# Patient Record
Sex: Male | Born: 2000 | Race: White | Hispanic: No | Marital: Single | Smoking: Never smoker
Health system: Southern US, Community
[De-identification: ages and names within clinical notes are randomized; demographics above are authoritative.]

## PROBLEM LIST (undated history)

## (undated) HISTORY — PX: KNEE ARTHROSCOPY W/ MENISCECTOMY: SHX1879

---
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
## 2019-01-06 ENCOUNTER — Emergency Department: Payer: Medicaid Other

## 2019-01-06 ENCOUNTER — Encounter: Payer: Self-pay | Admitting: Emergency Medicine

## 2019-01-06 ENCOUNTER — Emergency Department
Admission: EM | Admit: 2019-01-06 | Discharge: 2019-01-06 | Disposition: A | Discharge status: Home / Self Care | Payer: Medicaid Other | Attending: Emergency Medicine | Admitting: Emergency Medicine

## 2019-01-06 ENCOUNTER — Other Ambulatory Visit: Payer: Self-pay

## 2019-01-06 DIAGNOSIS — R0789 Other chest pain: Secondary | ICD-10-CM | POA: Insufficient documentation

## 2019-01-06 DIAGNOSIS — R079 Chest pain, unspecified: Secondary | ICD-10-CM | POA: Diagnosis present

## 2019-01-06 DIAGNOSIS — Z79899 Other long term (current) drug therapy: Secondary | ICD-10-CM | POA: Insufficient documentation

## 2019-01-06 LAB — CBC
HCT: 42.8 % (ref 39.0–52.0)
Hemoglobin: 15.1 g/dL (ref 13.0–17.0)
MCH: 30.3 pg (ref 26.0–34.0)
MCHC: 35.3 g/dL (ref 30.0–36.0)
MCV: 85.8 fL (ref 80.0–100.0)
Platelets: 289 10*3/uL (ref 150–400)
RBC: 4.99 MIL/uL (ref 4.22–5.81)
RDW: 12.2 % (ref 11.5–15.5)
WBC: 7.8 10*3/uL (ref 4.0–10.5)
nRBC: 0 % (ref 0.0–0.2)

## 2019-01-06 LAB — BASIC METABOLIC PANEL
Anion gap: 10 (ref 5–15)
BUN: 23 mg/dL — ABNORMAL HIGH (ref 6–20)
CO2: 24 mmol/L (ref 22–32)
Calcium: 9.1 mg/dL (ref 8.9–10.3)
Chloride: 104 mmol/L (ref 98–111)
Creatinine, Ser: 1.09 mg/dL (ref 0.61–1.24)
GFR calc Af Amer: >60 mL/min (ref >60)
GFR calc non Af Amer: >60 mL/min (ref >60)
Glucose, Bld: 105 mg/dL — ABNORMAL HIGH (ref 70–99)
Potassium: 3.8 mmol/L (ref 3.5–5.1)
Sodium: 138 mmol/L (ref 135–145)

## 2019-01-06 LAB — TROPONIN I: Troponin I: <0.03 ng/mL (ref <0.03)

## 2019-01-06 NOTE — ED Provider Notes (Signed)
[facility] Emergency Department Provider Note  ____________________________________________  Time seen: Approximately 9:58 PM  I have reviewed the triage vital signs and the nursing notes.   HISTORY  Chief Complaint Chest Pain    HPI Xavier Rodriguez is a 18 y.o. male with no significant past medical history who complains of mid anterior chest pain that started yesterday, has been continuous, no aggravating or alleviating factors.  Not exertional, not pleuritic.  No associated shortness of breath diaphoresis vomiting or radiation.  No dizziness or syncope.  Started sometime after having a COVID test which she notes was very uncomfortable with the nasopharyngeal swab collection.  He was tested as a screening measure due to a knee arthroscopy procedure scheduled for tomorrow.  EMR reviewed, COVID test negative      History reviewed. No pertinent past medical history.   Patient Active Problem List   Diagnosis Date Noted  . Hallux valgus of right foot 07/11/2016     Past Surgical History:  Procedure Laterality Date  . KNEE ARTHROSCOPY W/ MENISCECTOMY Right   . TOE SURGERY Left 08/23/2016   L great toe surgery to fix fracture per pt report     Prior to Admission medications   Medication Sig Start Date End Date Taking? Authorizing Provider  azithromycin (ZITHROMAX) 250 MG tablet 2 pills today then 1 pill a day for 4 days Patient not taking: Reported on 06/07/2018 05/27/17   Faythe Ghee, PA-C  benzonatate (TESSALON PERLES) 100 MG capsule Take 2 capsules (200 mg total) by mouth 3 (three) times daily as needed. 10/27/18 10/27/19  Joni Reining, PA-C  chlorpheniramine-HYDROcodone (TUSSIONEX PENNKINETIC ER) 10-8 MG/5ML SUER Take 5 mLs by mouth 2 (two) times daily. 10/27/18   Joni Reining, PA-C  MELOXICAM PO Take by mouth.    [provider]  methylPREDNISolone (MEDROL DOSEPAK) 4 MG TBPK tablet Take Tapered dose as directed.  Start tomorrow  morning.. 10/27/18   Joni Reining, PA-C     Allergies Patient has no known allergies.   No family history on file.  Social History Social History   Tobacco Use  . Smoking status: Never Smoker  . Smokeless tobacco: Never Used  Substance Use Topics  . Alcohol use: No  . Drug use: No    Review of Systems  Constitutional:   No fever or chills.  ENT:   No sore throat. No rhinorrhea. Cardiovascular:   Positive as above chest pain without syncope. Respiratory:   No dyspnea or cough. Gastrointestinal:   Negative for abdominal pain, vomiting and diarrhea.  Musculoskeletal:   Negative for focal pain or swelling All other systems reviewed and are negative except as documented above in ROS and HPI.  ____________________________________________   PHYSICAL EXAM:  VITAL SIGNS: ED Triage Vitals  Enc Vitals Group     BP --      Pulse --      Resp --      Temp --      Temp src --      SpO2 --      Weight 01/06/19 2049 215 lb (97.5 kg)     Height 01/06/19 2049 6' (1.829 m)     Head Circumference --      Peak Flow --      Pain Score 01/06/19 2048 7     Pain Loc --      Pain Edu? --      Excl. in GC? --     Vital  signs reviewed, nursing assessments reviewed.   Constitutional:   Alert and oriented. Non-toxic appearance. Eyes:   Conjunctivae are normal. EOMI. PERRL. ENT      Head:   Normocephalic and atraumatic.      Nose:   No congestion/rhinnorhea.       Mouth/Throat:   MMM, no pharyngeal erythema. No peritonsillar mass.       Neck:   No meningismus. Full ROM. Hematological/Lymphatic/Immunilogical:   No cervical lymphadenopathy. Cardiovascular:   RRR. Symmetric bilateral radial and DP pulses.  No murmurs. Cap refill less than 2 seconds. Respiratory:   Normal respiratory effort without tachypnea/retractions. Breath sounds are clear and equal bilaterally. No wheezes/rales/rhonchi. Gastrointestinal:   Soft and nontender. Non distended. There is no CVA tenderness.  No  rebound, rigidity, or guarding.  Musculoskeletal:   Normal range of motion in all extremities. No joint effusions.  No lower extremity tenderness.  No edema.  No chest wall tenderness Neurologic:   Normal speech and language.  Motor grossly intact. No acute focal neurologic deficits are appreciated.  Skin:    Skin is warm, dry and intact. No rash noted.  No petechiae, purpura, or bullae.  ____________________________________________    LABS (pertinent positives/negatives) (all labs ordered are listed, but only abnormal results are displayed) Labs Reviewed  BASIC METABOLIC PANEL - Abnormal; Notable for the following components:      Result Value   Glucose, Bld 105 (*)    BUN 23 (*)    All other components within normal limits  CBC  TROPONIN I   ____________________________________________   EKG  Interpreted by me  Date: 01/06/2019  Rate: 74  Rhythm: normal sinus rhythm  QRS Axis: normal  Intervals: normal  ST/T Wave abnormalities: normal  Conduction Disutrbances: none  Narrative Interpretation: unremarkable      ____________________________________________    RADIOLOGY  Dg Chest Port 1 View  Result Date: 01/06/2019 CLINICAL DATA:  Acute onset of cough, chest pain and shortness of breath. COVID-19 test pending. EXAM: PORTABLE CHEST 1 VIEW COMPARISON:  10/27/2018, 05/27/2017. FINDINGS: Cardiac silhouette and mediastinal contours normal in appearance for the AP portable technique. Pulmonary parenchyma clear. Bronchovascular markings normal. Pulmonary vascularity normal. No pneumothorax. No visible pleural effusions. IMPRESSION: No acute cardiopulmonary disease. Electronically Signed   By: Hulan Saas M.D.   On: 01/06/2019 21:19    ____________________________________________   PROCEDURES Procedures  ____________________________________________    CLINICAL IMPRESSION / ASSESSMENT AND PLAN / ED COURSE  Medications ordered in the ED: Medications - No  data to display  Pertinent labs & imaging results that were available during my care of the patient were reviewed by me and considered in my medical decision making (see chart for details).  Kendell Seiden was evaluated in Emergency Department on 01/06/2019 for the symptoms described in the history of present illness. He was evaluated in the context of the global COVID-19 pandemic, which necessitated consideration that the patient might be at risk for infection with the SARS-CoV-2 virus that causes COVID-19. Institutional protocols and algorithms that pertain to the evaluation of patients at risk for COVID-19 are in a state of rapid change based on information released by regulatory bodies including the CDC and federal and state organizations. These policies and algorithms were followed during the patient's care in the ED.   Patient presents with atypical chest pain, ongoing for the past 24 hours.Considering the patient's symptoms, medical history, and physical examination today, I have low suspicion for ACS, PE, TAD, pneumothorax, carditis, mediastinitis, pneumonia,  CHF, or sepsis.  EKG is normal in appearance, do not see any evidence of underlying dysrhythmia such as Brugada, HOCM, WPW, ARVD, long QT, short QT.  Think his symptoms are combination of mild dehydration due to working outside in sunny weather that was upper 80s degrees temperature, keto diet, and musculoskeletal from his weightlifting regimen, and some increased anxiousness in anticipation of a surgical procedure tomorrow.  Recommend lots of water before being n.p.o. at midnight.      ____________________________________________   FINAL CLINICAL IMPRESSION(S) / ED DIAGNOSES    Final diagnoses:  Nonspecific chest pain     ED Discharge Orders    None      Portions of this note were generated with dragon dictation software. Dictation errors may occur despite best attempts at proofreading.   Sharman CheekStafford, Joshlyn Beadle,  MD 01/06/19 2201

## 2019-01-06 NOTE — ED Notes (Signed)
MD Stafford at bedside. 

## 2019-01-06 NOTE — ED Triage Notes (Signed)
Pt states has cough, cp, shob. Pt was yested yesterday for covid but is awaiting results.

## 2019-01-06 NOTE — ED Triage Notes (Signed)
Pt reports that he is having mid sternum chest pain. Pt states occasional cough. Pt was tested for Covid yesterday due to needing a procedure tomorrow morning. Pt is in NAD.

## 2019-11-16 ENCOUNTER — Emergency Department: Payer: Medicaid Other

## 2019-11-16 ENCOUNTER — Other Ambulatory Visit: Payer: Self-pay

## 2019-11-16 ENCOUNTER — Emergency Department
Admission: EM | Admit: 2019-11-16 | Discharge: 2019-11-16 | Disposition: A | Discharge status: Home / Self Care | Payer: Medicaid Other | Attending: Emergency Medicine | Admitting: Emergency Medicine

## 2019-11-16 DIAGNOSIS — Y929 Unspecified place or not applicable: Secondary | ICD-10-CM | POA: Insufficient documentation

## 2019-11-16 DIAGNOSIS — W51XXXA Accidental striking against or bumped into by another person, initial encounter: Secondary | ICD-10-CM | POA: Diagnosis not present

## 2019-11-16 DIAGNOSIS — Z79899 Other long term (current) drug therapy: Secondary | ICD-10-CM | POA: Insufficient documentation

## 2019-11-16 DIAGNOSIS — Y999 Unspecified external cause status: Secondary | ICD-10-CM | POA: Insufficient documentation

## 2019-11-16 DIAGNOSIS — S0990XA Unspecified injury of head, initial encounter: Secondary | ICD-10-CM | POA: Insufficient documentation

## 2019-11-16 DIAGNOSIS — Y9372 Activity, wrestling: Secondary | ICD-10-CM | POA: Insufficient documentation

## 2019-11-16 NOTE — ED Triage Notes (Signed)
Pt states he thinks he got a concussion while wrestling. States a knee hit his head. Denies LOC. Denies blood thinner use. States pain is worse when jaw is opened. L sided head pain. A&O at this time, talking in complete sentences. Ambulatory.

## 2019-11-16 NOTE — ED Provider Notes (Signed)
Emergency Department Provider Note  ____________________________________________  Time seen: Approximately 4:02 PM  I have reviewed the triage vital signs and the nursing notes.   HISTORY  Chief Complaint Head Injury   Historian Patient     HPI Xavier Rodriguez is a 19 y.o. male with an unremarkable past medical history, presents to the emergency department after he was struck in the head by me while at wrestling practice.  Patient denies loss of consciousness but states that he has had dizziness since incident occurred.  Mom states that he has been slurring his words at home.  He also has 8 out of 10 jaw pain and states that his face is sore.  He denies changes in vision.  No numbness or tingling in the upper and lower extremities.  He endorses some mild neck discomfort but states that his neck usually hurts him some at baseline.  No chest pain, chest tightness or abdominal pain.   History reviewed. No pertinent past medical history.   Immunizations up to date:  Yes.     History reviewed. No pertinent past medical history.  Patient Active Problem List   Diagnosis Date Noted  . Hallux valgus of right foot 07/11/2016    Past Surgical History:  Procedure Laterality Date  . KNEE ARTHROSCOPY W/ MENISCECTOMY Right   . TOE SURGERY Left 08/23/2016   L great toe surgery to fix fracture per pt report    Prior to Admission medications   Medication Sig Start Date End Date Taking? Authorizing Provider  azithromycin (ZITHROMAX) 250 MG tablet 2 pills today then 1 pill a day for 4 days Patient not taking: Reported on 06/07/2018 05/27/17   Faythe Ghee, PA-C  chlorpheniramine-HYDROcodone (TUSSIONEX PENNKINETIC ER) 10-8 MG/5ML SUER Take 5 mLs by mouth 2 (two) times daily. 10/27/18   Joni Reining, PA-C  MELOXICAM PO Take by mouth.    [provider]  methylPREDNISolone (MEDROL DOSEPAK) 4 MG TBPK tablet Take Tapered dose as directed.  Start tomorrow morning.. 10/27/18    Joni Reining, PA-C    Allergies Patient has no known allergies.  History reviewed. No pertinent family history.  Social History Social History   Tobacco Use  . Smoking status: Never Smoker  . Smokeless tobacco: Never Used  Substance Use Topics  . Alcohol use: No  . Drug use: No     Review of Systems  Constitutional: No fever/chills Eyes:  No discharge ENT: No upper respiratory complaints. Respiratory: no cough. No SOB/ use of accessory muscles to breath Gastrointestinal:   No nausea, no vomiting.  No diarrhea.  No constipation. Musculoskeletal: Negative for musculoskeletal pain. Headache: Patient has headache.  Skin: Negative for rash, abrasions, lacerations, ecchymosis.   ____________________________________________   PHYSICAL EXAM:  VITAL SIGNS: ED Triage Vitals [11/16/19 1455]  Enc Vitals Group     BP (!) 145/116     Pulse Rate 92     Resp 16     Temp 98.6 F (37 C)     Temp Source Oral     SpO2 96 %     Weight 220 lb (99.8 kg)     Height 6' (1.829 m)     Head Circumference      Peak Flow      Pain Score 7     Pain Loc      Pain Edu?      Excl. in GC?      Constitutional: Alert and oriented. Well appearing and in no acute  distress. Eyes: Conjunctivae are normal. PERRL. EOMI. Head: Atraumatic.  No facial ecchymosis.  Patient does have pain with opening and closing the jaw. ENT:      Ears: TMs are pearly.       Nose: No congestion/rhinnorhea.      Mouth/Throat: Mucous membranes are moist.  Neck: No stridor.  No cervical spine tenderness to palpation. Cardiovascular: Normal rate, regular rhythm. Normal S1 and S2.  Good peripheral circulation. Respiratory: Normal respiratory effort without tachypnea or retractions. Lungs CTAB. Good air entry to the bases with no decreased or absent breath sounds Gastrointestinal: Bowel sounds x 4 quadrants. Soft and nontender to palpation. No guarding or rigidity. No distention. Musculoskeletal: Full range of  motion to all extremities. No obvious deformities noted Neurologic:  Normal for age. No gross focal neurologic deficits are appreciated.  Skin:  Skin is warm, dry and intact. No rash noted. Psychiatric: Mood and affect are normal for age. Speech and behavior are normal.   ____________________________________________   LABS (all labs ordered are listed, but only abnormal results are displayed)  Labs Reviewed - No data to display ____________________________________________  EKG   ____________________________________________  RADIOLOGY Geraldo Pitter, personally viewed and evaluated these images (plain radiographs) as part of my medical decision making, as well as reviewing the written report by the radiologist.    CT Head Wo Contrast  Result Date: 11/16/2019 CLINICAL DATA:  Facial and head trauma during wrestling. EXAM: CT HEAD WITHOUT CONTRAST CT MAXILLOFACIAL WITHOUT CONTRAST CT CERVICAL SPINE WITHOUT CONTRAST TECHNIQUE: Multidetector CT imaging of the head, cervical spine, and maxillofacial structures were performed using the standard protocol without intravenous contrast. Multiplanar CT image reconstructions of the cervical spine and maxillofacial structures were also generated. COMPARISON:  None. FINDINGS: CT HEAD FINDINGS Brain: No evidence of acute infarction, hemorrhage, hydrocephalus, extra-axial collection or mass lesion/mass effect. Vascular: No hyperdense vessel or unexpected calcification. Skull: Normal. Negative for fracture or focal lesion. Other: None. CT MAXILLOFACIAL FINDINGS Osseous: No fracture or mandibular dislocation. No destructive process. Orbits: Negative. No traumatic or inflammatory finding. Sinuses: Clear. Soft tissues: Negative. CT CERVICAL SPINE FINDINGS Alignment: Normal. Skull base and vertebrae: The posterior arch of C1 is unfused. No acute fracture. No primary bone lesion or focal pathologic process. Soft tissues and spinal canal: No prevertebral fluid or  swelling. No visible canal hematoma. Disc levels:  Preserved. Upper chest: Negative. Other: None. IMPRESSION: 1. No acute intracranial process. 2. No acute osseous injury in the face or cervical spine. Electronically Signed   By: Romona Curls M.D.   On: 11/16/2019 16:31   CT Cervical Spine Wo Contrast  Result Date: 11/16/2019 CLINICAL DATA:  Facial and head trauma during wrestling. EXAM: CT HEAD WITHOUT CONTRAST CT MAXILLOFACIAL WITHOUT CONTRAST CT CERVICAL SPINE WITHOUT CONTRAST TECHNIQUE: Multidetector CT imaging of the head, cervical spine, and maxillofacial structures were performed using the standard protocol without intravenous contrast. Multiplanar CT image reconstructions of the cervical spine and maxillofacial structures were also generated. COMPARISON:  None. FINDINGS: CT HEAD FINDINGS Brain: No evidence of acute infarction, hemorrhage, hydrocephalus, extra-axial collection or mass lesion/mass effect. Vascular: No hyperdense vessel or unexpected calcification. Skull: Normal. Negative for fracture or focal lesion. Other: None. CT MAXILLOFACIAL FINDINGS Osseous: No fracture or mandibular dislocation. No destructive process. Orbits: Negative. No traumatic or inflammatory finding. Sinuses: Clear. Soft tissues: Negative. CT CERVICAL SPINE FINDINGS Alignment: Normal. Skull base and vertebrae: The posterior arch of C1 is unfused. No acute fracture. No primary bone lesion or focal  pathologic process. Soft tissues and spinal canal: No prevertebral fluid or swelling. No visible canal hematoma. Disc levels:  Preserved. Upper chest: Negative. Other: None. IMPRESSION: 1. No acute intracranial process. 2. No acute osseous injury in the face or cervical spine. Electronically Signed   By: Zerita Boers M.D.   On: 11/16/2019 16:31   CT Maxillofacial Wo Contrast  Result Date: 11/16/2019 CLINICAL DATA:  Facial and head trauma during wrestling. EXAM: CT HEAD WITHOUT CONTRAST CT MAXILLOFACIAL WITHOUT CONTRAST CT  CERVICAL SPINE WITHOUT CONTRAST TECHNIQUE: Multidetector CT imaging of the head, cervical spine, and maxillofacial structures were performed using the standard protocol without intravenous contrast. Multiplanar CT image reconstructions of the cervical spine and maxillofacial structures were also generated. COMPARISON:  None. FINDINGS: CT HEAD FINDINGS Brain: No evidence of acute infarction, hemorrhage, hydrocephalus, extra-axial collection or mass lesion/mass effect. Vascular: No hyperdense vessel or unexpected calcification. Skull: Normal. Negative for fracture or focal lesion. Other: None. CT MAXILLOFACIAL FINDINGS Osseous: No fracture or mandibular dislocation. No destructive process. Orbits: Negative. No traumatic or inflammatory finding. Sinuses: Clear. Soft tissues: Negative. CT CERVICAL SPINE FINDINGS Alignment: Normal. Skull base and vertebrae: The posterior arch of C1 is unfused. No acute fracture. No primary bone lesion or focal pathologic process. Soft tissues and spinal canal: No prevertebral fluid or swelling. No visible canal hematoma. Disc levels:  Preserved. Upper chest: Negative. Other: None. IMPRESSION: 1. No acute intracranial process. 2. No acute osseous injury in the face or cervical spine. Electronically Signed   By: Zerita Boers M.D.   On: 11/16/2019 16:31    ____________________________________________    PROCEDURES  Procedure(s) performed:     Procedures     Medications - No data to display   ____________________________________________   INITIAL IMPRESSION / ASSESSMENT AND PLAN / ED COURSE  Pertinent labs & imaging results that were available during my care of the patient were reviewed by me and considered in my medical decision making (see chart for details).      Assessment and plan Head contusion 19 year old male presents to the emergency department with headache after an accident at wrestling practice where he was struck in the head with a  knee.  Patient was hypertensive at triage but vital signs were otherwise reassuring.  No neuro deficits were noted on physical exam.  We will obtain CT head, face and cervical spine will reassess...  CT head, face and cervical spine revealed no acute abnormality.  Tylenol and ibuprofen were recommended for headache.  Advised patient to reduce screen time for the next several days.  All patient questions were answered. ____________________________________________  FINAL CLINICAL IMPRESSION(S) / ED DIAGNOSES  Final diagnoses:  Injury of head, initial encounter      NEW MEDICATIONS STARTED DURING THIS VISIT:  ED Discharge Orders    None          This chart was dictated using voice recognition software/Dragon. Despite best efforts to proofread, errors can occur which can change the meaning. Any change was purely unintentional.     Lannie Fields, PA-C 11/16/19 2016    Nance Pear, MD 11/18/19 [phone]

## 2019-11-16 NOTE — ED Notes (Signed)
Pt states he was wrestling and got kneed in the head and is worried he has a concussion.

## 2020-04-10 IMAGING — CR DG CHEST 2V
1 series · 2 of 2 positions shown · non-contrast
Comparison: 05/27/2017

CLINICAL DATA: Pt arrives to ED with SOB and cough since [REDACTED]. Hx
pneumonia last year; states feels the same. No fever. Sharp pain in
center of chest when coughing. No resp distress noted. Non-smoker

EXAM:
CHEST - 2 VIEW

[Series 1: dg chest 2 view · 0.14mm/px · 2 of 2 slices shown]
[im 1/2]
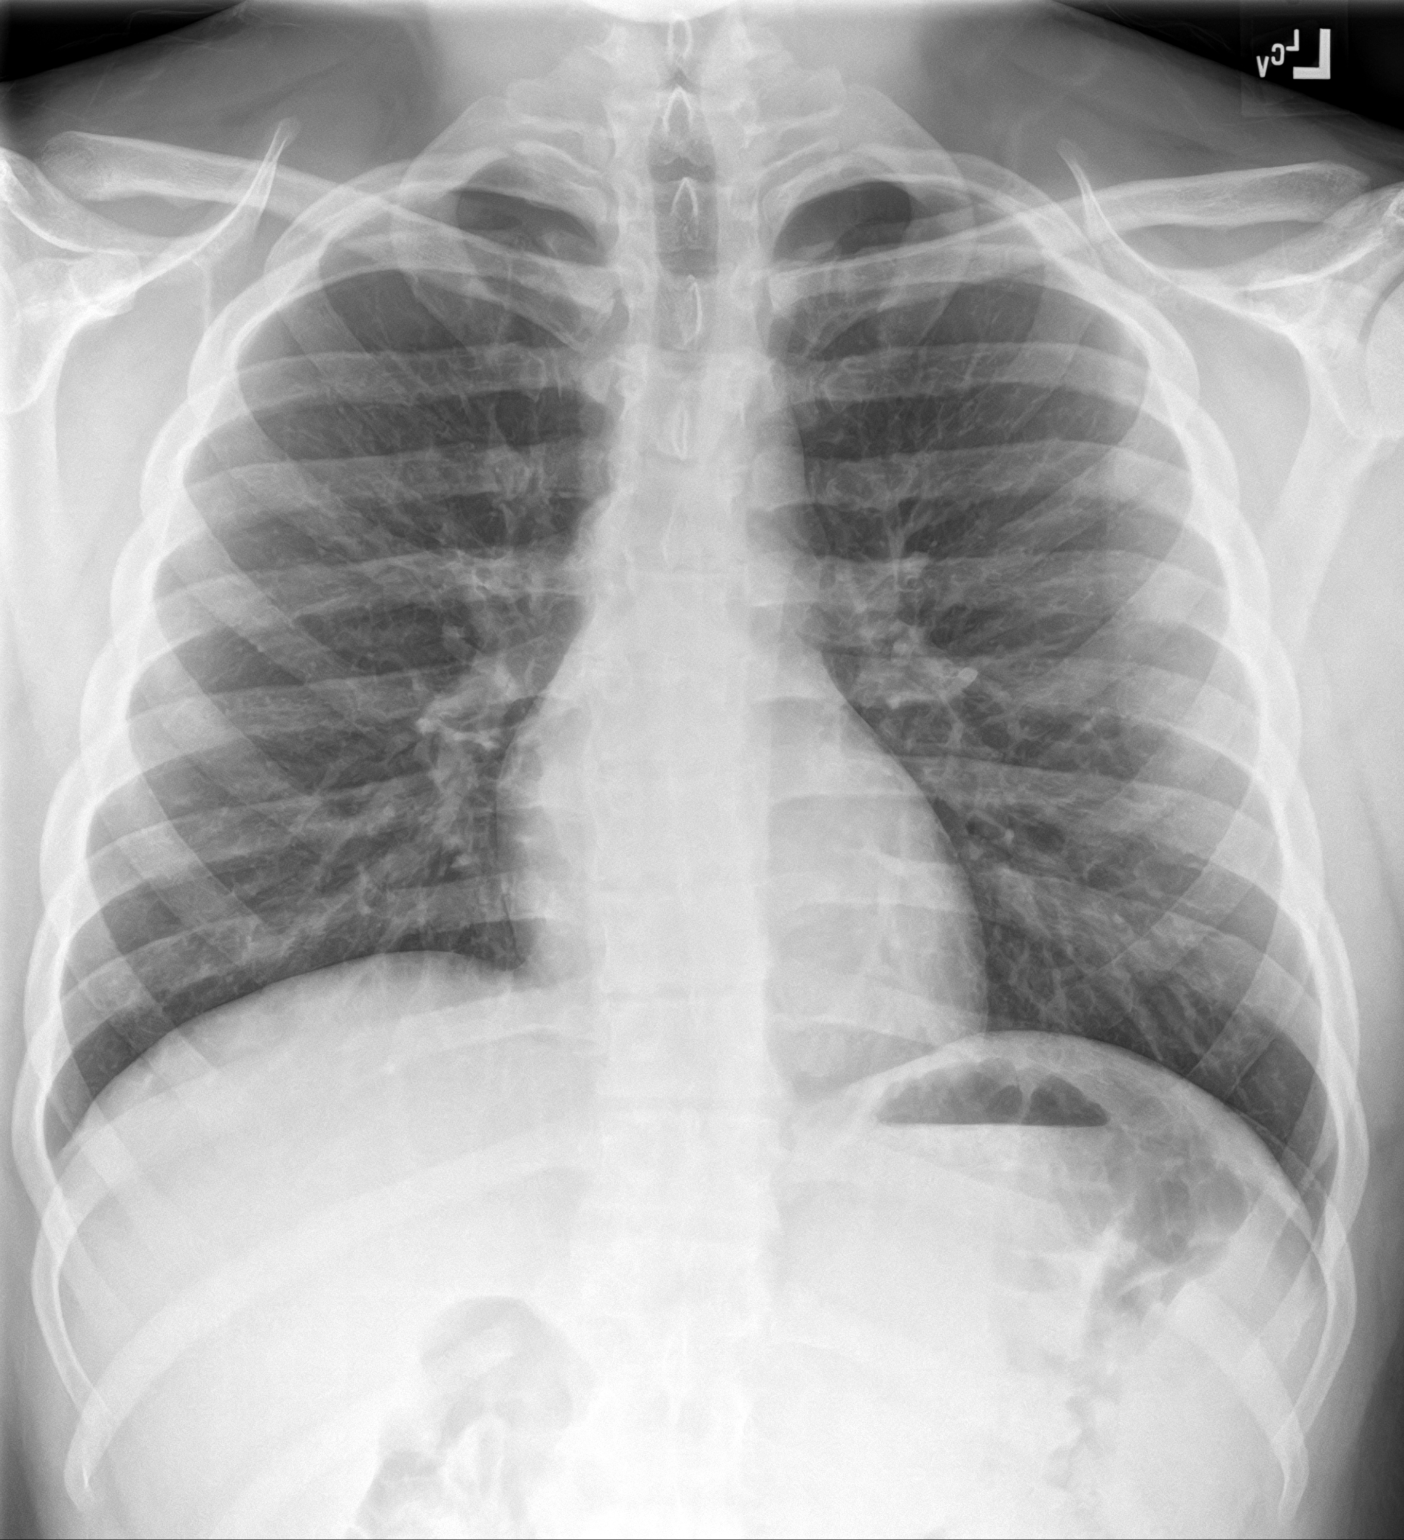
[im 2/2]
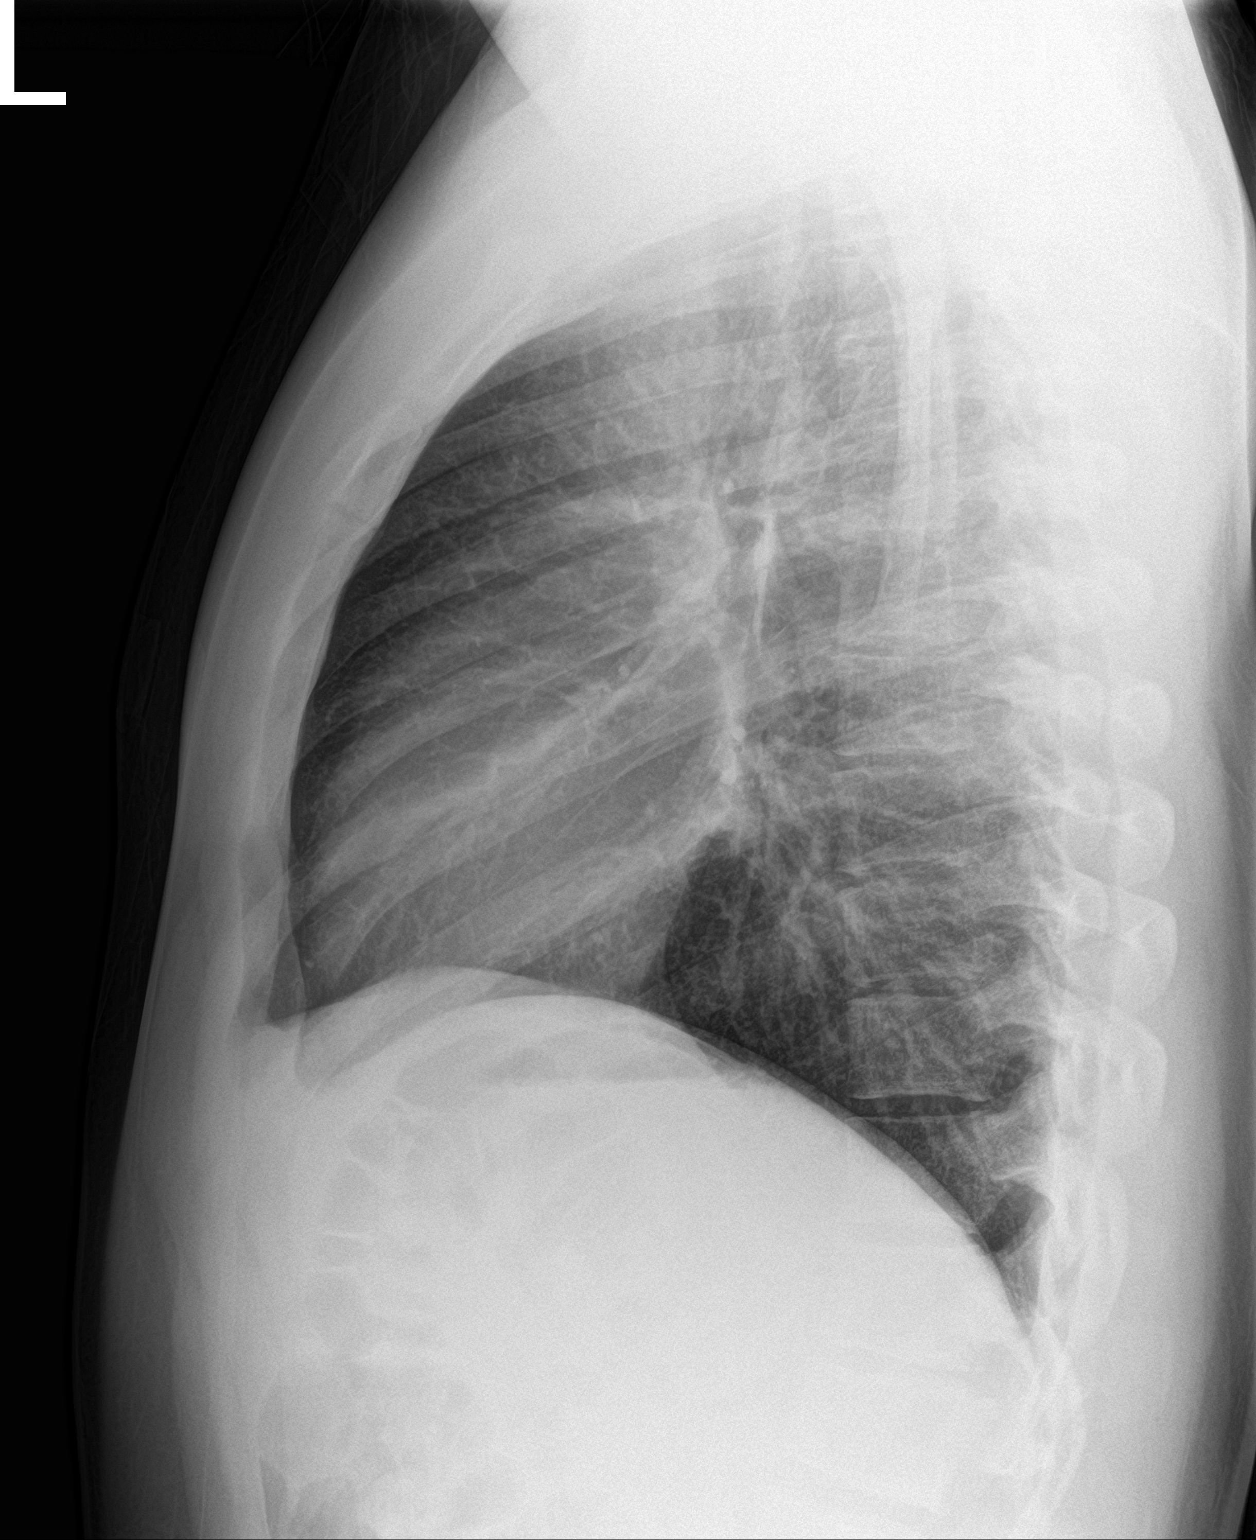

[2 of 2 positions shown; findings below may reference images not displayed]

FINDINGS: Midline trachea.  Normal heart size and mediastinal contours.

Sharp costophrenic angles.  No pneumothorax.  Clear lungs.
IMPRESSION: No active cardiopulmonary disease.

## 2020-04-26 ENCOUNTER — Emergency Department
Admission: EM | Admit: 2020-04-26 | Discharge: 2020-04-26 | Discharge status: Against Medical Advice | Payer: Medicaid Other

## 2020-05-06 ENCOUNTER — Ambulatory Visit
Admission: EM | Admit: 2020-05-06 | Discharge: 2020-05-06 | Disposition: A | Discharge status: Home / Self Care | Payer: Medicaid Other | Attending: Emergency Medicine | Admitting: Emergency Medicine

## 2020-05-06 DIAGNOSIS — J069 Acute upper respiratory infection, unspecified: Secondary | ICD-10-CM | POA: Diagnosis not present

## 2020-05-06 NOTE — ED Triage Notes (Signed)
Patient complains of cough, congestion, fatigue, and nasal drainage. Requesting COVID testing.

## 2020-05-06 NOTE — ED Provider Notes (Signed)
Renaldo Fiddler    CSN: [id number] Arrival date & time: 05/06/20  1315      History   Chief Complaint Chief Complaint  Patient presents with  . Nasal Congestion  . Cough  . Fatigue    HPI Xavier Rodriguez is a 19 y.o. male.   Patient presents with nonproductive cough, nasal congestion, postnasal drip, fatigue x3 days.  He denies fever, chills, sore throat, shortness of breath, rash, vomiting, diarrhea, or other symptoms.  No treatments attempted at home.  Patient has not received the COVID vaccines.  The history is provided by the patient.    History reviewed. No pertinent past medical history.  Patient Active Problem List   Diagnosis Date Noted  . Hallux valgus of right foot 07/11/2016    Past Surgical History:  Procedure Laterality Date  . KNEE ARTHROSCOPY W/ MENISCECTOMY Bilateral   . TOE SURGERY Left 08/23/2016   L great toe surgery to fix fracture per pt report       Home Medications    Prior to Admission medications   Medication Sig Start Date End Date Taking? Authorizing Provider  azithromycin (ZITHROMAX) 250 MG tablet 2 pills today then 1 pill a day for 4 days Patient not taking: Reported on 06/07/2018 05/27/17   Faythe Ghee, PA-C  chlorpheniramine-HYDROcodone (TUSSIONEX PENNKINETIC ER) 10-8 MG/5ML SUER Take 5 mLs by mouth 2 (two) times daily. 10/27/18   Joni Reining, PA-C  MELOXICAM PO Take by mouth.    [provider]  methylPREDNISolone (MEDROL DOSEPAK) 4 MG TBPK tablet Take Tapered dose as directed.  Start tomorrow morning.. 10/27/18   Joni Reining, PA-C    Family History History reviewed. No pertinent family history.  Social History Social History   Tobacco Use  . Smoking status: Never Smoker  . Smokeless tobacco: Never Used  Substance Use Topics  . Alcohol use: No  . Drug use: No     Allergies   Patient has no known allergies.   Review of Systems Review of Systems  Constitutional: Positive for fatigue. Negative  for chills and fever.  HENT: Positive for congestion and postnasal drip. Negative for ear pain and sore throat.   Eyes: Negative for pain and visual disturbance.  Respiratory: Positive for cough. Negative for shortness of breath.   Cardiovascular: Negative for chest pain and palpitations.  Gastrointestinal: Negative for abdominal pain, diarrhea and vomiting.  Genitourinary: Negative for dysuria and hematuria.  Musculoskeletal: Negative for arthralgias and back pain.  Skin: Negative for color change and rash.  Neurological: Negative for seizures and syncope.  All other systems reviewed and are negative.    Physical Exam Triage Vital Signs ED Triage Vitals  Enc Vitals Group     BP      Pulse      Resp      Temp      Temp src      SpO2      Weight      Height      Head Circumference      Peak Flow      Pain Score      Pain Loc      Pain Edu?      Excl. in GC?    No data found.  Updated Vital Signs BP 135/64   Pulse 78   Temp 99.5 F (37.5 C)   Resp 16   SpO2 98%   Visual Acuity Right Eye Distance:   Left Eye Distance:  Bilateral Distance:    Right Eye Near:   Left Eye Near:    Bilateral Near:     Physical Exam Vitals and nursing note reviewed.  Constitutional:      General: He is not in acute distress.    Appearance: He is well-developed. He is not ill-appearing.  HENT:     Head: Normocephalic and atraumatic.     Right Ear: Tympanic membrane normal.     Left Ear: Tympanic membrane normal.     Nose: Nose normal.     Mouth/Throat:     Mouth: Mucous membranes are moist.     Pharynx: Oropharynx is clear.  Eyes:     Conjunctiva/sclera: Conjunctivae normal.  Cardiovascular:     Rate and Rhythm: Normal rate and regular rhythm.     Heart sounds: No murmur heard.   Pulmonary:     Effort: Pulmonary effort is normal. No respiratory distress.     Breath sounds: Normal breath sounds.  Abdominal:     Palpations: Abdomen is soft.     Tenderness: There is no  abdominal tenderness. There is no guarding or rebound.  Musculoskeletal:     Cervical back: Neck supple.  Skin:    General: Skin is warm and dry.     Findings: No rash.  Neurological:     General: No focal deficit present.     Mental Status: He is alert and oriented to person, place, and time.     Gait: Gait normal.  Psychiatric:        Mood and Affect: Mood normal.        Behavior: Behavior normal.      UC Treatments / Results  Labs (all labs ordered are listed, but only abnormal results are displayed) Labs Reviewed  NOVEL CORONAVIRUS, NAA    EKG   Radiology No results found.  Procedures Procedures (including critical care time)  Medications Ordered in UC Medications - No data to display  Initial Impression / Assessment and Plan / UC Course  I have reviewed the triage vital signs and the nursing notes.  Pertinent labs & imaging results that were available during my care of the patient were reviewed by me and considered in my medical decision making (see chart for details).   Viral URI.  PCR COVID pending.  Instructed patient to self quarantine until the test result is back.  Discussed symptomatic treatment including Tylenol, rest, hydration.  Instructed patient to go to the ED if he has acute worsening symptoms.  Patient agrees to plan of care.    Final Clinical Impressions(s) / UC Diagnoses   Final diagnoses:  Viral URI     Discharge Instructions     Your COVID test is pending.  You should self quarantine until the test result is back.    Take Tylenol as needed for fever or discomfort.  Rest and keep yourself hydrated.    Go to the emergency department if you develop acute worsening symptoms.        ED Prescriptions    None     PDMP not reviewed this encounter.   Mickie Bail, NP 05/06/20 [phone]

## 2020-05-06 NOTE — Discharge Instructions (Addendum)
Your COVID test is pending.  You should self quarantine until the test result is back.    Take Tylenol as needed for fever or discomfort.  Rest and keep yourself hydrated.    Go to the emergency department if you develop acute worsening symptoms.     

## 2020-05-07 LAB — SARS-COV-2, NAA 2 DAY TAT

## 2020-05-07 LAB — NOVEL CORONAVIRUS, NAA: SARS-CoV-2, NAA: DETECTED — AB

## 2020-10-02 ENCOUNTER — Encounter: Payer: Self-pay | Admitting: Urology

## 2020-10-02 ENCOUNTER — Ambulatory Visit (INDEPENDENT_AMBULATORY_CARE_PROVIDER_SITE_OTHER): Payer: BC Managed Care – PPO | Admitting: Urology

## 2020-10-02 ENCOUNTER — Other Ambulatory Visit: Payer: Self-pay

## 2020-10-02 VITALS — BP 134/82 | HR 69 | Ht 72.0 in | Wt 225.0 lb

## 2020-10-02 DIAGNOSIS — N3941 Urge incontinence: Secondary | ICD-10-CM | POA: Diagnosis not present

## 2020-10-02 DIAGNOSIS — R32 Unspecified urinary incontinence: Secondary | ICD-10-CM | POA: Diagnosis not present

## 2020-10-02 LAB — MICROSCOPIC EXAMINATION

## 2020-10-02 LAB — URINALYSIS, COMPLETE
Bilirubin, UA: NEGATIVE
Glucose, UA: NEGATIVE
Ketones, UA: NEGATIVE
Leukocytes,UA: NEGATIVE
Nitrite, UA: NEGATIVE
Protein,UA: NEGATIVE
RBC, UA: NEGATIVE
Specific Gravity, UA: 1.02 (ref 1.005–1.030)
Urobilinogen, Ur: 1 mg/dL (ref 0.2–1.0)
pH, UA: 7 (ref 5.0–7.5)

## 2020-10-02 LAB — BLADDER SCAN AMB NON-IMAGING: Scan Result: 0

## 2020-10-02 NOTE — Progress Notes (Signed)
   10/02/2020 11:47 AM   Xavier Rodriguez October 23, 2000 [id number]  Referring provider: Gildardo Pounds, MD [address] [place],  [place] [address]  Chief Complaint  Patient presents with  . Urinary Incontinence  . Urinary Frequency    HPI: Xavier Rodriguez is a 20 y.o. male referred for urinary incontinence.   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 No pertinent past medical history.  Surgical History: Past Surgical History:  Procedure Laterality Date  . KNEE ARTHROSCOPY W/ MENISCECTOMY Bilateral   . TOE SURGERY Left 08/23/2016   L great toe surgery to fix fracture per pt report    Home Medications:  Allergies as of 10/02/2020   No Known Allergies     Medication List       Accurate as of October 02, 2020 11:47 AM. If you have any questions, ask your nurse or doctor.        STOP taking these medications   azithromycin 250 MG tablet Commonly known as: ZITHROMAX Stopped by: Riki Altes, MD   chlorpheniramine-HYDROcodone 10-8 MG/5ML Suer Commonly known as: Tussionex Pennkinetic ER Stopped by: Riki Altes, MD   methylPREDNISolone 4 MG Tbpk tablet Commonly known as: MEDROL DOSEPAK Stopped by: Riki Altes, MD     TAKE these medications   MELOXICAM PO Take by mouth.   oxybutynin 5 MG tablet Commonly known as: DITROPAN Take 5 mg by mouth 2 (two) times  daily.       Allergies: No Known Allergies  Family History: History reviewed. No pertinent family history.  Social History:  reports that he has never smoked. He has never used smokeless tobacco. He reports that he does not drink alcohol and does not use drugs.   Physical Exam: BP 134/82   Pulse 69   Ht 6' (1.829 m)   Wt 225 lb (102.1 kg)   BMI 30.52 kg/m   Constitutional:  Alert and oriented, No acute distress. HEENT: [place] AT, moist mucus membranes.  Trachea midline, no masses. Cardiovascular: No clubbing, cyanosis, or edema. Respiratory: Normal respiratory effort, no increased work of breathing. GU: He refused a GU exam Skin: No rashes, bruises or suspicious lesions. Neurologic: Grossly intact, no focal deficits, moving all 4 extremities. Psychiatric: Normal mood and affect.   Assessment & Plan:    1.  Urge incontinence  Isolated episode of urge incontinence which has not recurred  Bladder scan PVR was 0 mL  Urinalysis at [facility] was normal and repeat urinalysis here today was clear  Follow-up as needed for recurrent symptoms   Riki Altes, MD  [facility] [address] [place], [place] [address] [phone]

## 2020-10-16 ENCOUNTER — Ambulatory Visit: Payer: Medicaid Other | Admitting: Urology

## 2021-01-29 ENCOUNTER — Other Ambulatory Visit: Payer: Self-pay | Admitting: Pediatrics

## 2021-01-29 DIAGNOSIS — R1013 Epigastric pain: Secondary | ICD-10-CM

## 2021-02-05 ENCOUNTER — Ambulatory Visit
Admission: RE | Admit: 2021-02-05 | Discharge: 2021-02-05 | Disposition: A | Discharge status: Home / Self Care | Payer: BC Managed Care – PPO | Source: Ambulatory Visit | Attending: Pediatrics | Admitting: Pediatrics

## 2021-02-05 ENCOUNTER — Other Ambulatory Visit: Payer: Self-pay

## 2021-02-05 DIAGNOSIS — R1013 Epigastric pain: Secondary | ICD-10-CM | POA: Insufficient documentation

## 2021-03-19 ENCOUNTER — Other Ambulatory Visit: Payer: Self-pay

## 2021-03-19 DIAGNOSIS — Z0184 Encounter for antibody response examination: Secondary | ICD-10-CM

## 2021-03-20 LAB — HEPATITIS B SURFACE ANTIBODY,QUALITATIVE: Hep B Surface Ab, Qual: NONREACTIVE

## 2021-05-06 ENCOUNTER — Other Ambulatory Visit: Payer: Self-pay

## 2021-05-06 ENCOUNTER — Ambulatory Visit: Payer: Self-pay | Admitting: Physician Assistant

## 2021-05-06 ENCOUNTER — Encounter: Payer: Self-pay | Admitting: Physician Assistant

## 2021-05-06 VITALS — BP 142/70 | HR 74 | Temp 98.0°F | Resp 16 | Ht 72.0 in | Wt 241.0 lb

## 2021-05-06 DIAGNOSIS — L2389 Allergic contact dermatitis due to other agents: Secondary | ICD-10-CM

## 2021-05-06 DIAGNOSIS — S53429A Ulnohumeral (joint) sprain of unspecified elbow, initial encounter: Secondary | ICD-10-CM | POA: Insufficient documentation

## 2021-05-06 MED ORDER — HYDROXYZINE PAMOATE 25 MG PO CAPS: 25.0000 mg | ORAL_CAPSULE | Freq: Three times a day (TID) | ORAL | 0 refills | Status: DC | PRN

## 2021-05-06 MED ORDER — METHYLPREDNISOLONE 4 MG PO TBPK: ORAL_TABLET | ORAL | 0 refills | Status: DC

## 2021-05-06 MED ORDER — TRIAMCINOLONE ACETONIDE 40 MG/ML IJ SUSP
40.0000 mg | Freq: Once | INTRAMUSCULAR | Status: AC
  Administered 2021-05-06: 40 mg via INTRAMUSCULAR

## 2021-05-06 NOTE — Progress Notes (Signed)
Started as 1 little red bump on right arm this morning & now entire arm is covered.  Area doesn't itch, but it does burn.

## 2021-05-06 NOTE — Addendum Note (Signed)
Addended by: Gardner Candle on: 05/06/2021 04:09 PM   Modules accepted: Orders

## 2021-05-06 NOTE — Progress Notes (Signed)
   Subjective: Contact dermatitis    Patient ID: Xavier Rodriguez, male    DOB: [birth year], 20 y.o.   MRN: [id number]  HPI Patient presents for rash to bilateral upper extremities status post training at the [employer].  Patient stated they were practicing restraints on a mat when he started developing "burning/stinging sensation to the bilateral upper extremities.  Patient state medics at the site apply calamine lotion to the multiple vesicular lesions on his upper extremity.  Denies any anaphylactic signs or symptoms.   Review of Systems Negative except for complaint    Objective:   Physical Exam No acute distress.  Temperature 98, pulse 74, BP is 142/70, respiration 16, patient is 96% O2 sat on room air. Patient has papular lesions bilateral upper extremities.  The rest of the body is spared.       Assessment & Plan: Contact dermatitis   Patient given a prescription for Medrol Dosepak and Atarax.  Patient advised to return back if no improvement in 3 to 5 days.

## 2021-05-14 ENCOUNTER — Ambulatory Visit: Payer: Self-pay

## 2021-05-14 DIAGNOSIS — Z23 Encounter for immunization: Secondary | ICD-10-CM

## 2021-05-14 NOTE — Addendum Note (Signed)
Addended by: Christianne Dolin F on: 05/14/2021 11:58 AM   Modules accepted: Orders

## 2021-05-14 NOTE — Progress Notes (Signed)
Pt tolerated well, No reaction./CL,RMA

## 2021-06-25 ENCOUNTER — Other Ambulatory Visit: Payer: Self-pay

## 2021-06-25 DIAGNOSIS — Z0184 Encounter for antibody response examination: Secondary | ICD-10-CM

## 2021-06-25 NOTE — Progress Notes (Signed)
Pt presents today for HEP B titer.Xavier Rodriguez

## 2021-06-26 LAB — HEPATITIS B SURFACE ANTIBODY,QUALITATIVE: Hep B Surface Ab, Qual: REACTIVE

## 2021-11-25 ENCOUNTER — Other Ambulatory Visit: Payer: Self-pay | Admitting: Physician Assistant

## 2021-11-25 DIAGNOSIS — M545 Low back pain, unspecified: Secondary | ICD-10-CM

## 2021-11-25 DIAGNOSIS — S39012A Strain of muscle, fascia and tendon of lower back, initial encounter: Secondary | ICD-10-CM | POA: Diagnosis not present

## 2021-11-25 LAB — POCT URINALYSIS DIPSTICK
Bilirubin, UA: NEGATIVE
Blood, UA: NEGATIVE
Glucose, UA: NEGATIVE
Ketones, UA: NEGATIVE
Leukocytes, UA: NEGATIVE
Nitrite, UA: NEGATIVE
Protein, UA: NEGATIVE
Spec Grav, UA: 1.015 (ref 1.010–1.025)
Urobilinogen, UA: 0.2 E.U./dL
pH, UA: 6 (ref 5.0–8.0)

## 2021-11-25 MED ORDER — KETOROLAC TROMETHAMINE 10 MG PO TABS: 10.0000 mg | ORAL_TABLET | Freq: Four times a day (QID) | ORAL | 0 refills | Status: DC | PRN

## 2021-11-25 MED ORDER — ORPHENADRINE CITRATE ER 100 MG PO TB12: 100.0000 mg | ORAL_TABLET | Freq: Two times a day (BID) | ORAL | 0 refills | Status: DC

## 2021-11-25 NOTE — Progress Notes (Signed)
? ?  Subjective: Acute low back pain  ? ? Patient ID: Xavier Rodriguez, male    DOB: 2000-11-15, 21 y.o.   MRN: [id number] ? ?HPI ?Patient complaining of acute low back pain secondary to martial art workout 6 days ago.  Patient states he went to the emergency room at [facility] last night and was given Toradol injection and advised use over-the-counter Tylenol for his diagnosis of muscle strain.  Patient denies radicular component to his back pain.  Patient denies bladder or bowel dysfunction.  Patient states pain has increased.  Patient was concerned that no imagings were done on her lab works.  Patient rates the pain as a 6/10.  Described the pain as "achy". ? ?Review of Systems ?Negative except for chief complaint ?   ?Objective:  ? Physical Exam ? ?Moderate distress.  Examination of back reveals no obvious deformity.  Patient has moderate guarding palpation left paraspinal muscle area.  No ecchymosis or abrasion.  Dip UA was unremarkable. ? ? ?   ?Assessment & Plan: Acute low back pain  ?Patient complaining physical exam consistent with muscle strain.  Patient given a prescription for Norflex and Toradol.  Lidoderm patch was applied prior to departure.  Advised patient if no improvement within 24 hours follow-up with the emergency room since this clinic will be closed.   ? ?

## 2021-11-25 NOTE — Progress Notes (Signed)
Pt presents today with lower back pain since yesterday. Pt believes he has kidney stones, also went to er [facility]) and they did not treat him well. ?

## 2021-11-29 ENCOUNTER — Ambulatory Visit: Payer: Self-pay | Admitting: Physician Assistant

## 2021-12-09 ENCOUNTER — Other Ambulatory Visit: Payer: Self-pay

## 2021-12-09 DIAGNOSIS — J029 Acute pharyngitis, unspecified: Secondary | ICD-10-CM

## 2021-12-09 LAB — POCT RAPID STREP A (OFFICE): Rapid Strep A Screen: NEGATIVE

## 2021-12-09 NOTE — Progress Notes (Signed)
Presents to [facility] for on-site outdoor specimen collection for strep throat. ? ?S/Sx x 3days: ?Sore throat ?Mild dry cough ?Eyes hurt some ? ?Denies known exposure to strep throat ? ?Able to swallow food & fluids ? ?Taking Ibuprofen ? ?Rapid Strep Test = Negative ? ?Informed Kee of negative strep test results. ?Symptoms sound allergy related = pollen heavy at this time. ?Advised him to try OTC antihistamine (Claritin, Allegra, Zyrtec) & that we have some in the clinic if he needs some.   ?Follow-up is S/Sx worsen or not getting any better. ? ?AMD ?

## 2021-12-13 ENCOUNTER — Other Ambulatory Visit: Payer: Self-pay

## 2021-12-13 ENCOUNTER — Encounter: Payer: Self-pay | Admitting: Physician Assistant

## 2021-12-13 ENCOUNTER — Ambulatory Visit: Payer: Self-pay | Admitting: Physician Assistant

## 2021-12-13 DIAGNOSIS — J069 Acute upper respiratory infection, unspecified: Secondary | ICD-10-CM

## 2021-12-13 DIAGNOSIS — J029 Acute pharyngitis, unspecified: Secondary | ICD-10-CM

## 2021-12-13 LAB — POCT INFLUENZA A/B
Influenza A, POC: NEGATIVE
Influenza B, POC: NEGATIVE

## 2021-12-13 LAB — POCT RAPID STREP A (OFFICE): Rapid Strep A Screen: NEGATIVE

## 2021-12-13 LAB — POC COVID19 BINAXNOW: SARS Coronavirus 2 Ag: NEGATIVE

## 2021-12-13 MED ORDER — METHYLPREDNISOLONE 4 MG PO TBPK: ORAL_TABLET | ORAL | 0 refills | Status: DC

## 2021-12-13 MED ORDER — ALBUTEROL SULFATE HFA 108 (90 BASE) MCG/ACT IN AERS: 2.0000 | INHALATION_SPRAY | Freq: Four times a day (QID) | RESPIRATORY_TRACT | 0 refills | Status: DC | PRN

## 2021-12-13 MED ORDER — PSEUDOEPH-BROMPHEN-DM 30-2-10 MG/5ML PO SYRP: 5.0000 mL | ORAL_SOLUTION | Freq: Four times a day (QID) | ORAL | 0 refills | Status: DC | PRN

## 2021-12-13 NOTE — Progress Notes (Signed)
S/Sx x5 days: ?Sore throat - Able to swallow fluids & food ?Productive cough - green phlegm on Sat, but today it's tan ?Ears stopped up ?Mild discomfort to teeth ?Denies H/A, facial pain/pressure ? ?Taking Advil & Claritin D ? ?Tested for strep throat last Thursday & it was negative ? ?Hx of pneumonia in 2019 ?Hx of bronchitis when he was younger ? ?Rapid Strep test = Negative ?Rapid Influenza A = Negative ?Rapid Influenza B = Negative ?Rapid Covid test = Negative ?PCR Covid test = [facility] for processing ? ? ?

## 2021-12-13 NOTE — Progress Notes (Signed)
Pt presents for telehealth.  ?Pt tested negative for covid and strept throat. He is experiencing shortness of breath constantly, sore throat, and cough.  ? ?

## 2021-12-13 NOTE — Progress Notes (Signed)
? ?  Subjective: Cough, sore throat, and shortness of breath.  ? ? Patient ID: Xavier Rodriguez, male    DOB: 11-19-2000, 21 y.o.   MRN: [id number] ? ?HPI ?Patient complain of sore throat, cough, shortness of breath for 5 days.  Patient with tested negative for strep pharyngitis and COVID-19. ? ? ?Review of Systems ?Negative except for chief complaint ?   ?Objective:  ? Physical Exam ?This is a  telephonic encounter. ? ?   ?Assessment & Plan: URI with cough  ?Patient given prescription for Bromfed-DM, Medrol Dosepak, and a Ventolin inhaler.  Patient given a work note excuse for 1 day.  Take medication as directed and follow-up with this clinic in 3 days if no improvement or worsening complaints. ? ?

## 2021-12-15 LAB — NOVEL CORONAVIRUS, NAA: SARS-CoV-2, NAA: NOT DETECTED

## 2022-05-11 ENCOUNTER — Other Ambulatory Visit: Payer: Self-pay

## 2022-05-11 ENCOUNTER — Encounter: Payer: Self-pay | Admitting: Emergency Medicine

## 2022-05-11 ENCOUNTER — Emergency Department: Payer: 59

## 2022-05-11 ENCOUNTER — Emergency Department
Admission: EM | Admit: 2022-05-11 | Discharge: 2022-05-11 | Disposition: A | Discharge status: Home / Self Care | Payer: 59 | Attending: Emergency Medicine | Admitting: Emergency Medicine

## 2022-05-11 DIAGNOSIS — K529 Noninfective gastroenteritis and colitis, unspecified: Secondary | ICD-10-CM | POA: Diagnosis not present

## 2022-05-11 DIAGNOSIS — R110 Nausea: Secondary | ICD-10-CM | POA: Insufficient documentation

## 2022-05-11 DIAGNOSIS — R1031 Right lower quadrant pain: Secondary | ICD-10-CM | POA: Diagnosis not present

## 2022-05-11 DIAGNOSIS — R1030 Lower abdominal pain, unspecified: Secondary | ICD-10-CM | POA: Insufficient documentation

## 2022-05-11 DIAGNOSIS — R7401 Elevation of levels of liver transaminase levels: Secondary | ICD-10-CM | POA: Insufficient documentation

## 2022-05-11 LAB — COMPREHENSIVE METABOLIC PANEL
ALT: 62 U/L — ABNORMAL HIGH (ref 0–44)
AST: 28 U/L (ref 15–41)
Albumin: 4.6 g/dL (ref 3.5–5.0)
Alkaline Phosphatase: 61 U/L (ref 38–126)
Anion gap: 9 (ref 5–15)
BUN: 18 mg/dL (ref 6–20)
CO2: 23 mmol/L (ref 22–32)
Calcium: 9.8 mg/dL (ref 8.9–10.3)
Chloride: 106 mmol/L (ref 98–111)
Creatinine, Ser: 1.05 mg/dL (ref 0.61–1.24)
GFR, Estimated: >60 mL/min (ref >60)
Glucose, Bld: 109 mg/dL — ABNORMAL HIGH (ref 70–99)
Potassium: 4 mmol/L (ref 3.5–5.1)
Sodium: 138 mmol/L (ref 135–145)
Total Bilirubin: 1.1 mg/dL (ref 0.3–1.2)
Total Protein: 7.9 g/dL (ref 6.5–8.1)

## 2022-05-11 LAB — CBC WITH DIFFERENTIAL/PLATELET
Abs Immature Granulocytes: 0.03 10*3/uL (ref 0.00–0.07)
Basophils Absolute: 0 10*3/uL (ref 0.0–0.1)
Basophils Relative: 0 %
Eosinophils Absolute: 0.1 10*3/uL (ref 0.0–0.5)
Eosinophils Relative: 1 %
HCT: 48.1 % (ref 39.0–52.0)
Hemoglobin: 16.5 g/dL (ref 13.0–17.0)
Immature Granulocytes: 0 %
Lymphocytes Relative: 20 %
Lymphs Abs: 1.9 10*3/uL (ref 0.7–4.0)
MCH: 29.1 pg (ref 26.0–34.0)
MCHC: 34.3 g/dL (ref 30.0–36.0)
MCV: 84.8 fL (ref 80.0–100.0)
Monocytes Absolute: 1 10*3/uL (ref 0.1–1.0)
Monocytes Relative: 10 %
Neutro Abs: 6.6 10*3/uL (ref 1.7–7.7)
Neutrophils Relative %: 69 %
Platelets: 313 10*3/uL (ref 150–400)
RBC: 5.67 MIL/uL (ref 4.22–5.81)
RDW: 12.4 % (ref 11.5–15.5)
WBC: 9.6 10*3/uL (ref 4.0–10.5)
nRBC: 0 % (ref 0.0–0.2)

## 2022-05-11 LAB — URINALYSIS, ROUTINE W REFLEX MICROSCOPIC
Bilirubin Urine: NEGATIVE
Glucose, UA: NEGATIVE mg/dL
Hgb urine dipstick: NEGATIVE
Ketones, ur: 5 mg/dL — AB
Leukocytes,Ua: NEGATIVE
Nitrite: NEGATIVE
Protein, ur: NEGATIVE mg/dL
Specific Gravity, Urine: 1.028 (ref 1.005–1.030)
pH: 6 (ref 5.0–8.0)

## 2022-05-11 LAB — LIPASE, BLOOD: Lipase: 28 U/L (ref 11–51)

## 2022-05-11 MED ORDER — LACTATED RINGERS IV BOLUS
1000.0000 mL | Freq: Once | INTRAVENOUS | Status: AC
  Administered 2022-05-11: 1000 mL via INTRAVENOUS

## 2022-05-11 MED ORDER — IOHEXOL 350 MG/ML SOLN
100.0000 mL | Freq: Once | INTRAVENOUS | Status: AC | PRN
  Administered 2022-05-11: 100 mL via INTRAVENOUS

## 2022-05-11 MED ORDER — KETOROLAC TROMETHAMINE 30 MG/ML IJ SOLN
15.0000 mg | Freq: Once | INTRAMUSCULAR | Status: AC
  Administered 2022-05-11: 15 mg via INTRAVENOUS
  Filled 2022-05-11: qty 1

## 2022-05-11 MED ORDER — ONDANSETRON 4 MG PO TBDP: 4.0000 mg | ORAL_TABLET | Freq: Three times a day (TID) | ORAL | 0 refills | Status: DC | PRN

## 2022-05-11 NOTE — Discharge Instructions (Addendum)
Please take Tylenol and ibuprofen/Advil for your pain.  It is safe to take them together, or to alternate them every few hours.  Take up to 1000mg  of Tylenol at a time, up to 4 times per day.  Do not take more than 4000 mg of Tylenol in 24 hours.  For ibuprofen, take 400-600 mg, 3 - 4 times per day.  Use the Zofran as needed for nausea and vomiting  Reach out to the GI doctor to be seen in the clinic and discussed the utility of a colonoscopy.  The thought is the possibility of Crohn's disease considering the recurrent nature of the symptoms and the area of inflammation that we saw on your CT scan today.  If you develop any uncontrolled pain, fevers with your pain or any other worsening symptoms then please return to the ED in the meantime.

## 2022-05-11 NOTE — ED Triage Notes (Signed)
Patient ambulatory to triage with steady gait, without difficulty or distress noted; pt reports since last night having mid lower abd pain accomp by nausea; st hx of same with "acid reflux"

## 2022-05-11 NOTE — ED Provider Notes (Signed)
[facility] Provider Note    None    (approximate)   History   Abdominal Pain   HPI  Xavier Rodriguez is a 21 y.o. male who presents to the ED for evaluation of Abdominal Pain   Obese 21 year old male who presents to the ED for evaluation of lower abdominal pain and nausea.  Reports he has had a few episodes like this in the past in the past couple years and told it might be acid reflux.  He takes no regular prescription medications.  He reports being in his typical state of health this evening until developing fairly sudden and severe lower abdominal pain with nausea, pain woke him from sleep so he presents to the ED.  Reports concern for appendicitis.  Denies stool changes such as hematochezia, diarrhea, dysuria or emesis.  No fevers or systemic symptoms.   Physical Exam   Triage Vital Signs: ED Triage Vitals  Enc Vitals Group     BP 05/11/22 0156 (!) 152/96     Pulse Rate 05/11/22 0156 67     Resp 05/11/22 0156 20     Temp 05/11/22 0156 98.5 F (36.9 C)     Temp Source 05/11/22 0156 Oral     SpO2 05/11/22 0156 97 %     Weight 05/11/22 0155 240 lb (108.9 kg)     Height 05/11/22 0155 6' (1.829 m)     Head Circumference --      Peak Flow --      Pain Score 05/11/22 0155 8     Pain Loc --      Pain Edu? --      Excl. in GC? --     Most recent vital signs: Vitals:   05/11/22 0156  BP: (!) 152/96  Pulse: 67  Resp: 20  Temp: 98.5 F (36.9 C)  SpO2: 97%    General: Awake, no distress.  CV:  Good peripheral perfusion.  Resp:  Normal effort.  Abd:  No distention.  Lower abdominal tenderness is present to the RLQ and suprapubic abdomen.  No guarding or peritoneal features.  Upper abdomen is benign. MSK:  No deformity noted.  Neuro:  No focal deficits appreciated. Other:     ED Results / Procedures / Treatments   Labs (all labs ordered are listed, but only abnormal results are displayed) Labs Reviewed  COMPREHENSIVE METABOLIC PANEL -  Abnormal; Notable for the following components:      Result Value   Glucose, Bld 109 (*)    ALT 62 (*)    All other components within normal limits  URINALYSIS, ROUTINE W REFLEX MICROSCOPIC - Abnormal; Notable for the following components:   Color, Urine YELLOW (*)    APPearance CLEAR (*)    Ketones, ur 5 (*)    All other components within normal limits  CBC WITH DIFFERENTIAL/PLATELET  LIPASE, BLOOD    EKG   RADIOLOGY CT abdomen/pelvis interpreted by me without evidence of SBO or appendicitis.  Official radiology report(s): CT ABDOMEN PELVIS W CONTRAST  Result Date: 05/11/2022 CLINICAL DATA:  Lower abdominal pain with nausea, evaluate appendix EXAM: CT ABDOMEN AND PELVIS WITH CONTRAST TECHNIQUE: Multidetector CT imaging of the abdomen and pelvis was performed using the standard protocol following bolus administration of intravenous contrast. RADIATION DOSE REDUCTION: This exam was performed according to the departmental dose-optimization program which includes automated exposure control, adjustment of the mA and/or kV according to patient size and/or use of iterative reconstruction technique. CONTRAST:  127mL OMNIPAQUE IOHEXOL 350 MG/ML SOLN COMPARISON:  None Available. FINDINGS: Lower chest:  No contributory findings. Hepatobiliary: No focal liver abnormality.No evidence of biliary obstruction or stone. Pancreas: Unremarkable. Spleen: Unremarkable. Adrenals/Urinary Tract: Negative adrenals. No hydronephrosis or stone. Unremarkable bladder. Stomach/Bowel: Low-density wall thickening of the ileum with mesenteric fat stranding and interloop fluid. No appendicitis or underlying inflamed diverticulum. There is distortion of bowel loops without obstructive pattern. No skip inflammation or penetrating disease seen, the terminal ileum appears spared. Vascular/Lymphatic: No acute vascular abnormality. No mass or adenopathy. Reproductive:No pathologic findings. Other: Small reactive pelvic fluid.  No  pneumoperitoneum. Musculoskeletal: No acute abnormalities. IMPRESSION: Ileitis. Electronically Signed   By: Jorje Guild M.D.   On: 05/11/2022 04:44    PROCEDURES and INTERVENTIONS:  Procedures  Medications  lactated ringers bolus 1,000 mL (1,000 mLs Intravenous New Bag/Given 05/11/22 0403)  ketorolac (TORADOL) 30 MG/ML injection 15 mg (15 mg Intravenous Given 05/11/22 0401)  iohexol (OMNIPAQUE) 350 MG/ML injection 100 mL (100 mLs Intravenous Contrast Given 05/11/22 0417)     IMPRESSION / MDM / ASSESSMENT AND PLAN / ED COURSE  I reviewed the triage vital signs and the nursing notes.  Differential diagnosis includes, but is not limited to, appendicitis, cystitis, ureteral stone, Crohn's disease, pancreatitis  {Patient presents with symptoms of an acute illness or injury that is potentially life-threatening.  21 year old male presents to the ED with recurrence of lower abdominal pain, likely due to ileitis and ultimately suitable for outpatient management with GI follow-up.  He looks systemically well but does have some lower abdominal tenderness without peritoneal features.  Blood work is benign with normal lipase, normal CBC.  Urine without infectious features and ketones suggest dehydration.  His metabolic panel does have a marginal elevation of ALT that is nonspecific.  CT obtained without evidence of appendicitis, but does demonstrate an area of focal ileitis.  Considering the recurrent nature of his symptoms, Crohn's disease is a possibility.  His pain resolves with anti-inflammatories and we will discharge with the same and referral to GI.  Discussed return precautions.  Clinical Course as of 05/11/22 0501  Wed May 11, 2022  0451 Reassessed.  Patient reports feeling much better after the Toradol and is appreciative.  We discussed ileitis, possible etiologies and management at home.  Answered questions. [DS]    Clinical Course User Index [DS] Vladimir Crofts, MD     FINAL CLINICAL  IMPRESSION(S) / ED DIAGNOSES   Final diagnoses:  Ileitis  Lower abdominal pain     Rx / DC Orders   ED Discharge Orders          Ordered    ondansetron (ZOFRAN-ODT) 4 MG disintegrating tablet  Every 8 hours PRN        05/11/22 0456             Note:  This document was prepared using Dragon voice recognition software and may include unintentional dictation errors.   Vladimir Crofts, MD 05/11/22 [id number]

## 2022-05-14 NOTE — Progress Notes (Deleted)
Referring Provider: Pa, [facility]* Primary Care Physician:  Pa, [facility]  Reason for Consultation:  ***   IMPRESSION:  Lower abdominal pain not explained by CT  Elevated ALT. Given BMI 32.55, suspect fatty liver. However, liver normal on CT.   PLAN: ***   HPI: Kort Stettler is a 21 y.o. male presents for evaluation of lower abdominal pain.  Seen in ED 05/11/22 for abdominal pain. Normal CBC and lipase. Liver enzymes were normal except for ALT 62.  CT showed ileitis. Pain improved with Toradol.   UGI/SBFT 02/05/21: moderate gastroesophageal reflux  CT abd/pelvis 05/11/22: Low-density wall thickening of the ileum with mesenteric fat stranding and interloop fluid. No appendicitis or underlying inflamed diverticulum. There is distortion of bowel loops without obstructive pattern. No skip inflammation or penetrating disease seen, the terminal ileum appears spared.  There is no known family history of colon cancer or polyps. No family history of stomach cancer or other GI malignancy. No family history of inflammatory bowel disease or celiac.    No past medical history on file.  Past Surgical History:  Procedure Laterality Date   KNEE ARTHROSCOPY W/ MENISCECTOMY Bilateral    TOE SURGERY Left 08/23/2016   L great toe surgery to fix fracture per pt report    Prior to Admission medications   Medication Sig Start Date End Date Taking? Authorizing Provider  albuterol (VENTOLIN HFA) 108 (90 Base) MCG/ACT inhaler Inhale 2 puffs into the lungs every 6 (six) hours as needed for wheezing or shortness of breath. 12/13/21   Joni Reining, PA-C  brompheniramine-pseudoephedrine-DM 30-2-10 MG/5ML syrup Take 5 mLs by mouth 4 (four) times daily as needed. 12/13/21   Joni Reining, PA-C  methylPREDNISolone (MEDROL DOSEPAK) 4 MG TBPK tablet Take Tapered dose as directed 12/13/21   Joni Reining, PA-C  ondansetron (ZOFRAN-ODT) 4 MG disintegrating tablet Take 1 tablet (4  mg total) by mouth every 8 (eight) hours as needed. 05/11/22   Delton Prairie, MD    Current Outpatient Medications  Medication Sig Dispense Refill   albuterol (VENTOLIN HFA) 108 (90 Base) MCG/ACT inhaler Inhale 2 puffs into the lungs every 6 (six) hours as needed for wheezing or shortness of breath. 18 g 0   brompheniramine-pseudoephedrine-DM 30-2-10 MG/5ML syrup Take 5 mLs by mouth 4 (four) times daily as needed. 120 mL 0   methylPREDNISolone (MEDROL DOSEPAK) 4 MG TBPK tablet Take Tapered dose as directed 21 tablet 0   ondansetron (ZOFRAN-ODT) 4 MG disintegrating tablet Take 1 tablet (4 mg total) by mouth every 8 (eight) hours as needed. 20 tablet 0   No current facility-administered medications for this visit.    Allergies as of 05/16/2022   (No Known Allergies)    No family history on file.  Social History   Socioeconomic History   Marital status: Single    Spouse name: Not on file   Number of children: Not on file   Years of education: Not on file   Highest education level: Not on file  Occupational History   Not on file  Tobacco Use   Smoking status: Never   Smokeless tobacco: Never  Vaping Use   Vaping Use: Never used  Substance and Sexual Activity   Alcohol use: No   Drug use: No   Sexual activity: Not on file  Other Topics Concern   Not on file  Social History Narrative   Not on file   Social Determinants of Health   Financial Resource Strain: Not  on file  Food Insecurity: Not on file  Transportation Needs: Not on file  Physical Activity: Not on file  Stress: Not on file  Social Connections: Not on file  Intimate Partner Violence: Not on file    Review of Systems: 12 system ROS is negative except as noted above.   Physical Exam: General:   Alert,  well-nourished, pleasant and cooperative in NAD Head:  Normocephalic and atraumatic. Eyes:  Sclera clear, no icterus.   Conjunctiva pink. Ears:  Normal auditory acuity. Nose:  No deformity, discharge,  or  lesions. Mouth:  No deformity or lesions.   Neck:  Supple; no masses or thyromegaly. Lungs:  Clear throughout to auscultation.   No wheezes. Heart:  Regular rate and rhythm; no murmurs. Abdomen:  Soft, nontender, nondistended, normal bowel sounds, no rebound or guarding. No hepatosplenomegaly.   Rectal:  Deferred  Msk:  Symmetrical. No boney deformities LAD: No inguinal or umbilical LAD Extremities:  No clubbing or edema. Neurologic:  Alert and  oriented x4;  grossly nonfocal Skin:  Intact without significant lesions or rashes. Psych:  Alert and cooperative. Normal mood and affect.     Rodrecus Belsky L. Tarri Glenn, MD, MPH 05/14/2022, 6:09 PM

## 2022-05-16 ENCOUNTER — Ambulatory Visit: Payer: 59 | Admitting: Gastroenterology

## 2022-09-12 ENCOUNTER — Other Ambulatory Visit: Payer: Self-pay

## 2022-09-13 ENCOUNTER — Ambulatory Visit: Payer: 59 | Admitting: Gastroenterology

## 2022-09-13 ENCOUNTER — Telehealth: Payer: Self-pay | Admitting: Gastroenterology

## 2022-09-13 ENCOUNTER — Encounter: Payer: Self-pay | Admitting: Gastroenterology

## 2022-09-13 NOTE — Telephone Encounter (Signed)
No show appointment  

## 2023-02-09 ENCOUNTER — Ambulatory Visit: Payer: Self-pay

## 2023-02-09 DIAGNOSIS — Z0000 Encounter for general adult medical examination without abnormal findings: Secondary | ICD-10-CM

## 2023-02-09 LAB — POCT URINALYSIS DIPSTICK
Bilirubin, UA: NEGATIVE
Blood, UA: NEGATIVE
Glucose, UA: NEGATIVE
Ketones, UA: NEGATIVE
Leukocytes, UA: NEGATIVE
Nitrite, UA: NEGATIVE
Protein, UA: NEGATIVE
Spec Grav, UA: >=1.03 — AB (ref 1.010–1.025)
Urobilinogen, UA: 0.2 E.U./dL
pH, UA: 6 (ref 5.0–8.0)

## 2023-02-09 NOTE — Progress Notes (Signed)
Scheduled to complete physical 0.02/16/2023 with Blima Ledger, NP-C.  AMD

## 2023-02-10 LAB — CMP12+LP+TP+TSH+6AC+CBC/D/PLT
ALT: 52 IU/L — ABNORMAL HIGH (ref 0–44)
AST: 23 IU/L (ref 0–40)
Albumin: 4.5 g/dL (ref 4.3–5.2)
Alkaline Phosphatase: 78 IU/L (ref 44–121)
BUN/Creatinine Ratio: 12 (ref 9–20)
BUN: 13 mg/dL (ref 6–20)
Basophils Absolute: 0.1 10*3/uL (ref 0.0–0.2)
Basos: 1 %
Bilirubin Total: 0.5 mg/dL (ref 0.0–1.2)
Calcium: 9.3 mg/dL (ref 8.7–10.2)
Chloride: 103 mmol/L (ref 96–106)
Chol/HDL Ratio: 3 ratio (ref 0.0–5.0)
Cholesterol, Total: 154 mg/dL (ref 100–199)
Creatinine, Ser: 1.12 mg/dL (ref 0.76–1.27)
EOS (ABSOLUTE): 0.2 10*3/uL (ref 0.0–0.4)
Eos: 2 %
Estimated CHD Risk: <0.5 times avg. (ref 0.0–1.0)
Free Thyroxine Index: 2.1 (ref 1.2–4.9)
GGT: 66 IU/L — ABNORMAL HIGH (ref 0–65)
Globulin, Total: 2.1 g/dL (ref 1.5–4.5)
Glucose: 100 mg/dL — ABNORMAL HIGH (ref 70–99)
HDL: 51 mg/dL (ref >39)
Hematocrit: 48.9 % (ref 37.5–51.0)
Hemoglobin: 16.6 g/dL (ref 13.0–17.7)
Immature Grans (Abs): 0 10*3/uL (ref 0.0–0.1)
Immature Granulocytes: 0 %
Iron: 91 ug/dL (ref 38–169)
LDH: 172 IU/L (ref 121–224)
LDL Chol Calc (NIH): 89 mg/dL (ref 0–99)
Lymphocytes Absolute: 2.7 10*3/uL (ref 0.7–3.1)
Lymphs: 33 %
MCH: 29.3 pg (ref 26.6–33.0)
MCHC: 33.9 g/dL (ref 31.5–35.7)
MCV: 86 fL (ref 79–97)
Monocytes Absolute: 0.9 10*3/uL (ref 0.1–0.9)
Monocytes: 11 %
Neutrophils Absolute: 4.4 10*3/uL (ref 1.4–7.0)
Neutrophils: 53 %
Phosphorus: 3.9 mg/dL (ref 2.8–4.1)
Platelets: 343 10*3/uL (ref 150–450)
Potassium: 4.2 mmol/L (ref 3.5–5.2)
RBC: 5.66 x10E6/uL (ref 4.14–5.80)
RDW: 12.2 % (ref 11.6–15.4)
Sodium: 140 mmol/L (ref 134–144)
T3 Uptake Ratio: 29 % (ref 24–39)
T4, Total: 7.3 ug/dL (ref 4.5–12.0)
TSH: 2.5 u[IU]/mL (ref 0.450–4.500)
Total Protein: 6.6 g/dL (ref 6.0–8.5)
Triglycerides: 73 mg/dL (ref 0–149)
Uric Acid: 5.3 mg/dL (ref 3.8–8.4)
VLDL Cholesterol Cal: 14 mg/dL (ref 5–40)
WBC: 8.3 10*3/uL (ref 3.4–10.8)
eGFR: 95 mL/min/{1.73_m2} (ref >59)

## 2023-02-16 ENCOUNTER — Encounter: Payer: Self-pay | Admitting: Adult Health

## 2023-02-16 ENCOUNTER — Ambulatory Visit: Payer: Self-pay | Admitting: Adult Health

## 2023-02-16 VITALS — BP 141/83 | HR 76 | Temp 97.6°F | Resp 16 | Ht 72.0 in | Wt 240.0 lb

## 2023-02-16 DIAGNOSIS — Z0000 Encounter for general adult medical examination without abnormal findings: Secondary | ICD-10-CM

## 2023-02-16 NOTE — Progress Notes (Signed)
[facility] [address] [place], [place] [address]  Internal MEDICINE  Office Visit Note  Patient Name: Xavier Rodriguez  [id number]  [id number]  Date of Service: 02/16/2023  Chief Complaint  Patient presents with   Annual Exam     HPI Pt is here for routine health maintenance examination.  He is a well-appearing 22 year old male.  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  He is currently single and does not have any children.  He is not aware of any family medical history.  He denies any allergies, current medications or complaints.  His surgical history is remarkable for bilateral meniscal repair, and a left toe surgery.  He does not use any alcohol or illicit drugs.  He does report about once a month using a Zyn.  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  He has been using the incline treadmill for some time preparing for a mountain climbing expedition at [employer] near next month.  Current Medication: Outpatient Encounter Medications as of 02/16/2023  Medication Sig   [DISCONTINUED] albuterol (VENTOLIN HFA) 108 (90 Base) MCG/ACT inhaler Inhale 2 puffs into the lungs every 6 (six) hours as needed for wheezing or shortness of breath.   [DISCONTINUED] brompheniramine-pseudoephedrine-DM 30-2-10 MG/5ML syrup Take 5 mLs by mouth 4 (four) times daily as needed.   [DISCONTINUED] methylPREDNISolone (MEDROL DOSEPAK) 4 MG TBPK tablet Take Tapered dose as directed   No facility-administered encounter medications on file as of 02/16/2023.    Surgical History: Past Surgical History:  Procedure Laterality Date   KNEE ARTHROSCOPY W/ MENISCECTOMY Bilateral    TOE SURGERY Left 08/23/2016   L great toe surgery to fix fracture per pt report    Medical History: History reviewed. No pertinent past medical history.  Family History: History  reviewed. No pertinent family history.  Social History: Social History   Socioeconomic History   Marital status: Single    Spouse name: Not on file   Number of children: Not on file   Years of education: Not on file   Highest education level: Not on file  Occupational History   Not on file  Tobacco Use   Smoking status: Never   Smokeless tobacco: Never  Vaping Use   Vaping Use: Never used  Substance and Sexual Activity   Alcohol use: No   Drug use: No   Sexual activity: Not on file  Other Topics Concern   Not on file  Social History Narrative   Not on file   Social Determinants of Health   Financial Resource Strain: Not on file  Food Insecurity: Not on file  Transportation Needs: Not on file  Physical Activity: Not on file  Stress: Not on file  Social Connections: Not on file      Review of Systems  Constitutional:  Negative for activity change, appetite change and fatigue.  HENT:  Negative for congestion, sinus pain, trouble swallowing and voice change.   Eyes:  Negative for pain, discharge and visual disturbance.  Respiratory:  Negative for cough, chest tightness and shortness of breath.   Cardiovascular:  Negative for chest pain and leg swelling.  Gastrointestinal:  Negative for abdominal distention, abdominal pain, constipation and diarrhea.  Musculoskeletal:  Negative for arthralgias, back pain and neck pain.  Skin:  Negative for color change.  Neurological:  Negative for dizziness, weakness and headaches.  Hematological:  Negative for adenopathy.  Psychiatric/Behavioral:  Negative for agitation, confusion and suicidal ideas.      Vital Signs: BP (!) 141/83   Pulse 76   Temp 97.6 F (36.4 C) (Temporal)   Resp 16   Ht 6' (1.829 m)   Wt 240 lb (108.9 kg)   SpO2 96%   BMI 32.55 kg/m    Physical Exam Constitutional:      Appearance: Normal appearance.  HENT:     Head: Normocephalic.     Right Ear: Tympanic membrane normal.     Left Ear:  Tympanic membrane normal.     Nose: Nose normal.     Mouth/Throat:     Mouth: Mucous membranes are moist.     Pharynx: No oropharyngeal exudate or posterior oropharyngeal erythema.  Eyes:     General:        Right eye: No discharge.        Left eye: No discharge.     Extraocular Movements: Extraocular movements intact.     Pupils: Pupils are equal, round, and reactive to light.  Cardiovascular:     Rate and Rhythm: Normal rate and regular rhythm.     Pulses: Normal pulses.     Heart sounds: Normal heart sounds. No murmur heard. Pulmonary:     Effort: Pulmonary effort is normal. No respiratory distress.     Breath sounds: Normal breath sounds. No wheezing or rhonchi.  Abdominal:     General: Abdomen is flat. Bowel sounds are normal. There is no distension.     Palpations: There is no mass.     Tenderness: There is no abdominal tenderness. There is no guarding.     Hernia: No hernia is present.  Musculoskeletal:        General: No swelling or deformity. Normal range of motion.     Cervical back: Normal range of motion.  Skin:    General: Skin is warm and dry.     Capillary Refill: Capillary refill takes less than 2 seconds.  Neurological:     General: No focal deficit present.     Mental Status: He is alert.     Cranial Nerves: No cranial nerve deficit.     Gait: Gait normal.  Psychiatric:        Mood and Affect: Mood normal.        Behavior: Behavior normal.        Judgment: Judgment normal.      LABS: Recent Results (from the past 2160 hour(s))  CMP12+LP+TP+TSH+6AC+CBC/D/Plt     Status: Abnormal   Collection Time: 02/09/23  1:41 PM  Result Value Ref Range   Glucose 100 (H) 70 - 99 mg/dL   Uric Acid 5.3 3.8 - 8.4 mg/dL    Comment:            Therapeutic target for gout patients: <6.0   BUN 13 6 - 20 mg/dL   Creatinine, Ser 1.61 0.76 - 1.27 mg/dL   eGFR 95 >09 UE/AVW/0.98   BUN/Creatinine Ratio 12 9 - 20   Sodium 140 134 - 144 mmol/L   Potassium 4.2 3.5 - 5.2  mmol/L   Chloride 103 96 - 106 mmol/L   Calcium 9.3 8.7 - 10.2 mg/dL   Phosphorus 3.9 2.8 - 4.1 mg/dL   Total Protein 6.6 6.0 - 8.5 g/dL   Albumin 4.5 4.3 - 5.2 g/dL   Globulin, Total 2.1 1.5 - 4.5 g/dL  Bilirubin Total 0.5 0.0 - 1.2 mg/dL   Alkaline Phosphatase 78 44 - 121 IU/L   LDH 172 121 - 224 IU/L   AST 23 0 - 40 IU/L   ALT 52 (H) 0 - 44 IU/L   GGT 66 (H) 0 - 65 IU/L   Iron 91 38 - 169 ug/dL   Cholesterol, Total 604 100 - 199 mg/dL   Triglycerides 73 0 - 149 mg/dL   HDL 51 >54 mg/dL   VLDL Cholesterol Cal 14 5 - 40 mg/dL   LDL Chol Calc (NIH) 89 0 - 99 mg/dL   Chol/HDL Ratio 3.0 0.0 - 5.0 ratio    Comment:                                   T. Chol/HDL Ratio                                             Men  Women                               1/2 Avg.Risk  3.4    3.3                                   Avg.Risk  5.0    4.4                                2X Avg.Risk  9.6    7.1                                3X Avg.Risk 23.4   11.0    Estimated CHD Risk  < 0.5 0.0 - 1.0 times avg.    Comment: The CHD Risk is based on the T. Chol/HDL ratio. Other factors affect CHD Risk such as hypertension, smoking, diabetes, severe obesity, and family history of premature CHD.    TSH 2.500 0.450 - 4.500 uIU/mL   T4, Total 7.3 4.5 - 12.0 ug/dL   T3 Uptake Ratio 29 24 - 39 %   Free Thyroxine Index 2.1 1.2 - 4.9   WBC 8.3 3.4 - 10.8 x10E3/uL   RBC 5.66 4.14 - 5.80 x10E6/uL   Hemoglobin 16.6 13.0 - 17.7 g/dL   Hematocrit 09.8 11.9 - 51.0 %   MCV 86 79 - 97 fL   MCH 29.3 26.6 - 33.0 pg   MCHC 33.9 31.5 - 35.7 g/dL   RDW 14.7 82.9 - 56.2 %   Platelets 343 150 - 450 x10E3/uL   Neutrophils 53 Not Estab. %   Lymphs 33 Not Estab. %   Monocytes 11 Not Estab. %   Eos 2 Not Estab. %   Basos 1 Not Estab. %   Neutrophils Absolute 4.4 1.4 - 7.0 x10E3/uL   Lymphocytes Absolute 2.7 0.7 - 3.1 x10E3/uL   Monocytes Absolute 0.9 0.1 - 0.9 x10E3/uL   EOS (ABSOLUTE) 0.2 0.0 - 0.4 x10E3/uL    Basophils Absolute 0.1 0.0 - 0.2 x10E3/uL   Immature Granulocytes 0 Not Estab. %  Immature Grans (Abs) 0.0 0.0 - 0.1 x10E3/uL  POCT urinalysis dipstick     Status: Abnormal   Collection Time: 02/09/23  2:21 PM  Result Value Ref Range   Color, UA Amber    Clarity, UA Clear    Glucose, UA Negative Negative   Bilirubin, UA Negative    Ketones, UA Negative    Spec Grav, UA >=1.030 (A) 1.010 - 1.025   Blood, UA Negative    pH, UA 6.0 5.0 - 8.0   Protein, UA Negative Negative   Urobilinogen, UA 0.2 0.2 or 1.0 E.U./dL   Nitrite, UA Negative    Leukocytes, UA Negative Negative   Appearance     Odor      Assessment/Plan: 1. Annual physical exam Up to date on PHM. Denies any complaints or needs. Follow up as needed.  LAbs are reviewed.  His blood glucose was 100 mg/dl, however he believes he had eaten some sweets while on duty, and it may not have been 8 hours before his blood was drawn.      General Counseling: Teddie verbalizes understanding of the findings of todays visit and agrees with plan of treatment. I have discussed any further diagnostic evaluation that may be needed or ordered today. We also reviewed his medications today. he has been encouraged to call the office with any questions or concerns that should arise related to todays visit.    No orders of the defined types were placed in this encounter.   No orders of the defined types were placed in this encounter.   Total time spent:20 Minutes  Time spent includes review of chart, medications, test results, and follow up plan with the patient.    Johnna Acosta AGNP-C Nurse Practitioner

## 2023-02-16 NOTE — Progress Notes (Signed)
Pt presents today to complete physical, Pt denies any issues or concerns at this time/CL,RMA 

## 2023-05-08 ENCOUNTER — Encounter: Payer: Self-pay | Admitting: Emergency Medicine

## 2023-05-08 ENCOUNTER — Other Ambulatory Visit: Payer: Self-pay

## 2023-05-08 ENCOUNTER — Emergency Department
Admission: EM | Admit: 2023-05-08 | Discharge: 2023-05-08 | Disposition: A | Discharge status: Home / Self Care | Payer: No Typology Code available for payment source | Attending: Emergency Medicine | Admitting: Emergency Medicine

## 2023-05-08 DIAGNOSIS — S51812A Laceration without foreign body of left forearm, initial encounter: Secondary | ICD-10-CM | POA: Diagnosis not present

## 2023-05-08 DIAGNOSIS — S59912A Unspecified injury of left forearm, initial encounter: Secondary | ICD-10-CM | POA: Diagnosis present

## 2023-05-08 DIAGNOSIS — W268XXA Contact with other sharp object(s), not elsewhere classified, initial encounter: Secondary | ICD-10-CM | POA: Diagnosis not present

## 2023-05-08 MED ORDER — LIDOCAINE-EPINEPHRINE 2 %-1:100000 IJ SOLN
20.0000 mL | Freq: Once | INTRAMUSCULAR | Status: AC
  Administered 2023-05-08: 20 mL
  Filled 2023-05-08: qty 1

## 2023-05-08 NOTE — ED Provider Notes (Signed)
[facility] Provider Note    Event Date/Time   First MD Initiated Contact with Patient 05/08/23 [phone]     (approximate)   History   Chief Complaint Laceration   HPI  Xavier Rodriguez is a 23 y.o. male with no significant past medical history who presents to the ED complaining of laceration.  Patient reports that just prior to arrival he was trying to cut a piece of rubber off of a car when he slipped and cut his left forearm.  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  Tourniquet has been in place for about 15 minutes by the time he arrived in the ED.  Bleeding currently controlled with tourniquet in place, patient reports some tingling in his left hand but has been able to move the hand without difficulty.  He states that the knife was completely intact after the injury.     Physical Exam   Triage Vital Signs: ED Triage Vitals  Encounter Vitals Group     BP      Systolic BP Percentile      Diastolic BP Percentile      Pulse      Resp      Temp      Temp src      SpO2      Weight      Height      Head Circumference      Peak Flow      Pain Score      Pain Loc      Pain Education      Exclude from Growth Chart     Most recent vital signs: Vitals:   05/08/23 0708  BP: 118/89  Pulse: 86  Resp: 18  Temp: 97.7 F (36.5 C)  SpO2: 99%    Constitutional: Alert and oriented. Eyes: Conjunctivae are normal. Head: Atraumatic. Nose: No congestion/rhinnorhea. Mouth/Throat: Mucous membranes are moist.  Cardiovascular: Normal rate, regular rhythm. Grossly normal heart sounds.  2+ radial pulses bilaterally, cap refill less than 2 seconds in all digits of left hand. Respiratory: Normal respiratory effort.  No retractions. Lungs CTAB. Gastrointestinal: Soft and nontender. No distention. Musculoskeletal: No lower extremity tenderness nor edema.  7 cm laceration to the left forearm,  minimal oozing of blood noted with removal of tourniquet.  Range of motion intact throughout left hand and wrist. Neurologic:  Normal speech and language. No gross focal neurologic deficits are appreciated.    ED Results / Procedures / Treatments   Labs (all labs ordered are listed, but only abnormal results are displayed) Labs Reviewed - No data to display   PROCEDURES:  Critical Care performed: No  ..Laceration Repair  Date/Time: 05/08/2023 7:56 AM  Performed by: Chesley Noon, MD Authorized by: Chesley Noon, MD   Consent:    Consent obtained:  Verbal   Consent given by:  Patient   Risks discussed:  Infection, pain, retained foreign body, tendon damage, vascular damage, poor wound healing, poor cosmetic result, need for additional repair and nerve damage Universal protocol:    Patient identity confirmed:  Arm band and verbally with patient Anesthesia:    Anesthesia method:  Local infiltration   Local anesthetic:  Lidocaine 2% WITH epi Laceration details:    Location:  Shoulder/arm   Shoulder/arm location:  L lower arm   Length (cm):  7 Exploration:    Limited defect created (wound extended): no  Hemostasis achieved with:  Epinephrine and direct pressure   Wound exploration: wound explored through full range of motion and entire depth of wound visualized     Wound extent: areolar tissue not violated, fascia not violated, no foreign body, no signs of injury, no nerve damage, no tendon damage, no underlying fracture and no vascular damage     Contaminated: no   Treatment:    Area cleansed with:  Saline   Amount of cleaning:  Standard   Irrigation solution:  Sterile saline   Irrigation method:  Pressure wash   Debridement:  None   Undermining:  None   Scar revision: no   Skin repair:    Repair method:  Sutures   Suture size:  3-0   Suture material:  Nylon   Suture technique:  Simple interrupted   Number of sutures:  7 Approximation:    Approximation:   Close Repair type:    Repair type:  Simple Post-procedure details:    Dressing:  Open (no dressing)   Procedure completion:  Tolerated well, no immediate complications    MEDICATIONS ORDERED IN ED: Medications  lidocaine-EPINEPHrine (XYLOCAINE W/EPI) 2 %-1:100000 (with pres) injection 20 mL (20 mLs Infiltration Given 05/08/23 0719)     IMPRESSION / MDM / ASSESSMENT AND PLAN / ED COURSE  I reviewed the triage vital signs and the nursing notes.                              22 y.o. male with no significant past medical history presents to the ED complaining of laceration to his left forearm just prior to arrival requiring tourniquet application.  Patient's presentation is most consistent with acute presentation with potential threat to life or bodily function.  Differential diagnosis includes, but is not limited to, simple laceration, tendon injury, nerve injury, vascular injury.  Patient nontoxic-appearing and in no acute distress, vital signs are unremarkable.  Tourniquet was removed shortly after arrival to the ED with minimal bleeding noted, no pulsatile bleeding or evidence of vascular injury.  He is neurovascular intact to his left hand, no concerns for retained foreign body at this time.  Laceration was repaired without difficulty and patient reports tetanus shot within the past 5 years.  He is appropriate for discharge home with follow-up in 1 week for suture removal, was counseled to return to the ED for new or worsening symptoms.  Patient agrees with plan.      FINAL CLINICAL IMPRESSION(S) / ED DIAGNOSES   Final diagnoses:  Laceration of left forearm, initial encounter     Rx / DC Orders   ED Discharge Orders     None        Note:  This document was prepared using Dragon voice recognition software and may include unintentional dictation errors.   Chesley Noon, MD 05/08/23 [phone]

## 2023-05-08 NOTE — ED Notes (Signed)
Dressing applied to left forearm.

## 2023-05-08 NOTE — ED Triage Notes (Signed)
Pt to ED via POV for laceration to left arm- states cut arm accidentally with knife. Bleeding noted- tourniquet placed 10 min PTA.

## 2023-05-10 ENCOUNTER — Ambulatory Visit: Payer: Self-pay

## 2023-05-10 NOTE — Progress Notes (Addendum)
Seen for follow up post work injury on 05/08/23 with stitches to left mid forearm at [facility] ED.  Area clean and dry with stitches intact and no complaints.  Note given by provider to return to work full duty today and keep area covered for protection.  To return next week for stitches removal on day 7-10.

## 2023-05-15 ENCOUNTER — Other Ambulatory Visit: Payer: Self-pay | Admitting: Physician Assistant

## 2023-05-17 ENCOUNTER — Ambulatory Visit: Payer: Self-pay | Admitting: Physician Assistant

## 2023-05-17 ENCOUNTER — Encounter: Payer: Self-pay | Admitting: Physician Assistant

## 2023-05-17 VITALS — BP 126/75 | HR 94 | Temp 98.7°F | Resp 12

## 2023-05-17 DIAGNOSIS — Z4802 Encounter for removal of sutures: Secondary | ICD-10-CM

## 2023-05-17 NOTE — Progress Notes (Signed)
Cutting a mud flap off a lady's car & cutting toward himself & knife slipped & cut left inner forearm.  Presents to [facility] clinic today for suture removal.  AMD

## 2023-05-17 NOTE — Progress Notes (Signed)
Subjective: Suture removal    Patient ID: Xavier Rodriguez, male    DOB: [birth year], 22 y.o.   MRN: [id number]  HPI Patient presents for suture removal secondary to laceration sustained to the left forearm 9 days ago.  Wound was treated in the emergency room.  Patient denies any concerns or complaints.   Review of Systems Negative except for above complaint    Objective:   Physical Exam BP 126/75  BP Location Left Arm  Patient Position Sitting  Cuff Size Large  Pulse 94  Resp 12  Temp 98.7 F (37.1 C)  Temp src Oral  SpO2 96 %  No acute distress.  Right-hand-dominant.  Left forearm reveals 6 cm laceration with 7 interrupted sutures.  Wound shows no signs symptom secondary infection.  Neurovascular intact with free and equal range of motion.       Assessment & Plan: Suture removal  Obtain patient consent for suture removal.  Area was surgically clean and 7 interrupted sutures were removed.  Area was reclean and bandaged.  Patient given post discharge instructions for wound care.  Return back to clinic if condition worsens.

## 2023-06-12 DIAGNOSIS — S0501XA Injury of conjunctiva and corneal abrasion without foreign body, right eye, initial encounter: Secondary | ICD-10-CM | POA: Diagnosis not present

## 2023-06-14 ENCOUNTER — Ambulatory Visit: Payer: 59 | Admitting: Physician Assistant

## 2023-06-14 DIAGNOSIS — S0501XD Injury of conjunctiva and corneal abrasion without foreign body, right eye, subsequent encounter: Secondary | ICD-10-CM | POA: Diagnosis not present

## 2023-07-03 DIAGNOSIS — H1820 Unspecified corneal edema: Secondary | ICD-10-CM | POA: Diagnosis not present

## 2023-07-12 DIAGNOSIS — H52221 Regular astigmatism, right eye: Secondary | ICD-10-CM | POA: Diagnosis not present

## 2023-08-28 ENCOUNTER — Encounter: Payer: Self-pay | Admitting: Physician Assistant

## 2023-08-28 ENCOUNTER — Ambulatory Visit: Payer: Self-pay | Admitting: Physician Assistant

## 2023-08-28 VITALS — BP 129/72 | HR 82 | Temp 98.6°F | Resp 16

## 2023-08-28 DIAGNOSIS — J189 Pneumonia, unspecified organism: Secondary | ICD-10-CM

## 2023-08-28 MED ORDER — BENZONATATE 100 MG PO CAPS: 100.0000 mg | ORAL_CAPSULE | Freq: Three times a day (TID) | ORAL | 0 refills | Status: DC | PRN

## 2023-08-28 MED ORDER — FEXOFENADINE-PSEUDOEPHED ER 60-120 MG PO TB12: 1.0000 | ORAL_TABLET | Freq: Two times a day (BID) | ORAL | 0 refills | Status: DC

## 2023-08-28 MED ORDER — AZITHROMYCIN 250 MG PO TABS: ORAL_TABLET | ORAL | 0 refills | Status: AC

## 2023-08-28 NOTE — Progress Notes (Signed)
   Subjective: Cough and congestion    Patient ID: Xavier Rodriguez, male    DOB: 05-26-2001, 23 y.o.   MRN: [id number]  HPI Patient complain of sinus congestion and chest congestion for 5 days.  Also complain of productive cough.  Denies recent travel or known contact with COVID-19.  States intermitting fevers with complaint.  Using over-the-counter Mucinex which increases productive cough.   Review of Systems Negative except for above complaint    Objective:   Physical Exam BP 129/72  BP Location Left Arm  Patient Position Sitting  Cuff Size Large  Pulse Rate 82  Temp 98.6 F (37 C)  Temp Source Oral  Resp 16  SpO2 97 %  No acute distress.  Appears fatigued. HEENT is remarkable for edematous bilateral TM.  Edematous nasal turbinates.  Postnasal drainage. Neck is supple for lymphadenopathy or bruits. Patient has left upper/mid rhonchi breath sounds. Heart is regular rate and rhythm      Assessment & Plan: Pneumonia left upper lobe  Z-Pak, Tessalon , and Allegra-D.  Patient given work note for 2 days.

## 2023-08-28 NOTE — Progress Notes (Signed)
 S/Sx started Thursday: Sinus congestion - Nasal drainage - orangish/yellow Cough - Yellow mucus & first thing in AM looks orange - blood tinged SOB - at rest Headache Ears feel stopped up Sore throat - able to swallow ( no issues with eating/drinking) Chills Thursday & Friday nights - didn't check temp with a thermometer  Denies facial pain/pressure, teeth discomfort, N/V/D  Worried S/Sx similar to when he had Pneumonia a couple of years ago  Taking OTC Mucinex x1 day (yesterday) & drinking Homemade Teas No antihistamines or decongestants

## 2023-11-01 ENCOUNTER — Encounter: Payer: Self-pay | Admitting: Physician Assistant

## 2023-11-01 ENCOUNTER — Ambulatory Visit: Payer: Self-pay | Admitting: Physician Assistant

## 2023-11-01 VITALS — BP 130/67 | HR 74 | Resp 14 | Ht 72.0 in | Wt 230.0 lb

## 2023-11-01 DIAGNOSIS — M5459 Other low back pain: Secondary | ICD-10-CM

## 2023-11-01 MED ORDER — KETOROLAC TROMETHAMINE 10 MG PO TABS: 10.0000 mg | ORAL_TABLET | Freq: Four times a day (QID) | ORAL | 0 refills | Status: AC | PRN

## 2023-11-01 MED ORDER — KETOROLAC TROMETHAMINE 30 MG/ML IJ SOLN
30.0000 mg | Freq: Once | INTRAMUSCULAR | Status: AC
  Administered 2023-11-01: 30 mg via INTRAMUSCULAR

## 2023-11-01 MED ORDER — ORPHENADRINE CITRATE ER 100 MG PO TB12: 100.0000 mg | ORAL_TABLET | Freq: Two times a day (BID) | ORAL | 0 refills | Status: AC

## 2023-11-01 NOTE — Progress Notes (Signed)
   Subjective: Back pain    Patient ID: Xavier Rodriguez, male    DOB: Jun 01, 2001, 23 y.o.   MRN: [id number]  HPI Patient complaining of left flank pain secondary to a slip and fall 4 days ago.  Denies bladder or bowel dysfunction.  Denies radicular component to pain complaint.  States pain increases with right lateral movements.   Review of Systems Negative except for above complaint    Objective:   Physical Exam BP 130/67  Cuff Size Large  Pulse Rate 74  Weight 230 lb (104.3 kg)  Height 6' (1.829 m)  Resp 14  SpO2 97 %   Other Vitals   BMI: 31.19 kg/m2  BSA: 2.30 m2  No acute distress. Examination lumbar spine shows no obvious deformity.  No guarding palpation spinal processes.  Patient free and equal range of motion flexion and extension.  Decreased range of motion with right lateral movements secondary to left flank pain.  Palpable left paraspinal muscle spasms with right lateral movements.  Negative straight leg test       Assessment & Plan: Lumbar strain   Patient given Toradol 30 mg IM along for Lidoderm patch.  Advised with past patient given prescription for Toradol and 4 times daily for 5 days and Norflex 30 mg for 5 days.  Patient given work note for 2 days labs return if no improvement or worsening of complaint.

## 2023-11-01 NOTE — Progress Notes (Signed)
 Pt was treated for back pain in 2021. Pt states that same pain is similar to what he's feeling now. 5 days ago [place] and [place]. Pt now complains of left lower pain.
